# Patient Record
Sex: Male | Born: 1940 | Race: White | Hispanic: No | Marital: Married | Smoking: Never smoker
Health system: Southern US, Community
[De-identification: ages and names within clinical notes are randomized; demographics above are authoritative.]

## PROBLEM LIST (undated history)

## (undated) DIAGNOSIS — E785 Hyperlipidemia, unspecified: Secondary | ICD-10-CM

## (undated) DIAGNOSIS — I350 Nonrheumatic aortic (valve) stenosis: Secondary | ICD-10-CM

## (undated) DIAGNOSIS — I10 Essential (primary) hypertension: Secondary | ICD-10-CM

## (undated) DIAGNOSIS — L98429 Non-pressure chronic ulcer of back with unspecified severity: Secondary | ICD-10-CM

## (undated) DIAGNOSIS — E119 Type 2 diabetes mellitus without complications: Secondary | ICD-10-CM

## (undated) HISTORY — DX: Type 2 diabetes mellitus without complications: E11.9

## (undated) HISTORY — PX: OTHER SURGICAL HISTORY: SHX169

## (undated) HISTORY — DX: Essential (primary) hypertension: I10

## (undated) HISTORY — DX: Hyperlipidemia, unspecified: E78.5

## (undated) HISTORY — PX: SHOULDER SURGERY: SHX246

---

## 1998-11-09 ENCOUNTER — Encounter: Admission: RE | Admit: 1998-11-09 | Discharge: 1999-02-07 | Payer: Self-pay

## 2000-07-09 ENCOUNTER — Ambulatory Visit (HOSPITAL_COMMUNITY): Admission: RE | Admit: 2000-07-09 | Discharge: 2000-07-09 | Payer: Self-pay | Admitting: Orthopedic Surgery

## 2000-07-09 ENCOUNTER — Encounter: Payer: Self-pay | Admitting: Orthopedic Surgery

## 2000-07-25 ENCOUNTER — Encounter: Payer: Self-pay | Admitting: Orthopedic Surgery

## 2000-07-31 ENCOUNTER — Inpatient Hospital Stay (HOSPITAL_COMMUNITY): Admission: RE | Admit: 2000-07-31 | Discharge: 2000-08-03 | Payer: Self-pay | Admitting: Orthopedic Surgery

## 2000-07-31 ENCOUNTER — Encounter: Payer: Self-pay | Admitting: Orthopedic Surgery

## 2000-07-31 ENCOUNTER — Encounter (INDEPENDENT_AMBULATORY_CARE_PROVIDER_SITE_OTHER): Payer: Self-pay | Admitting: Specialist

## 2000-09-13 ENCOUNTER — Observation Stay (HOSPITAL_COMMUNITY): Admission: RE | Admit: 2000-09-13 | Discharge: 2000-09-14 | Payer: Self-pay | Admitting: Orthopedic Surgery

## 2000-10-09 ENCOUNTER — Inpatient Hospital Stay (HOSPITAL_COMMUNITY): Admission: EM | Admit: 2000-10-09 | Discharge: 2000-10-11 | Payer: Self-pay | Admitting: Orthopedic Surgery

## 2000-10-14 ENCOUNTER — Inpatient Hospital Stay (HOSPITAL_COMMUNITY): Admission: EM | Admit: 2000-10-14 | Discharge: 2000-10-16 | Payer: Self-pay | Admitting: Emergency Medicine

## 2000-12-13 ENCOUNTER — Inpatient Hospital Stay (HOSPITAL_COMMUNITY): Admission: RE | Admit: 2000-12-13 | Discharge: 2000-12-17 | Payer: Self-pay | Admitting: Orthopedic Surgery

## 2000-12-16 ENCOUNTER — Encounter: Payer: Self-pay | Admitting: Orthopedic Surgery

## 2001-01-27 ENCOUNTER — Inpatient Hospital Stay (HOSPITAL_COMMUNITY): Admission: RE | Admit: 2001-01-27 | Discharge: 2001-01-31 | Payer: Self-pay | Admitting: Orthopedic Surgery

## 2001-01-27 ENCOUNTER — Encounter: Payer: Self-pay | Admitting: Orthopedic Surgery

## 2001-01-27 ENCOUNTER — Encounter (INDEPENDENT_AMBULATORY_CARE_PROVIDER_SITE_OTHER): Payer: Self-pay

## 2001-02-20 ENCOUNTER — Encounter: Payer: Self-pay | Admitting: Orthopedic Surgery

## 2001-02-20 ENCOUNTER — Encounter: Admission: RE | Admit: 2001-02-20 | Discharge: 2001-02-20 | Payer: Self-pay | Admitting: Orthopedic Surgery

## 2001-10-20 ENCOUNTER — Inpatient Hospital Stay (HOSPITAL_COMMUNITY): Admission: RE | Admit: 2001-10-20 | Discharge: 2001-10-23 | Payer: Self-pay | Admitting: Orthopedic Surgery

## 2001-10-20 ENCOUNTER — Encounter (INDEPENDENT_AMBULATORY_CARE_PROVIDER_SITE_OTHER): Payer: Self-pay

## 2001-10-20 ENCOUNTER — Encounter: Payer: Self-pay | Admitting: Orthopedic Surgery

## 2002-01-26 ENCOUNTER — Observation Stay (HOSPITAL_COMMUNITY): Admission: RE | Admit: 2002-01-26 | Discharge: 2002-01-27 | Payer: Self-pay | Admitting: Orthopedic Surgery

## 2003-10-01 ENCOUNTER — Inpatient Hospital Stay (HOSPITAL_COMMUNITY): Admission: RE | Admit: 2003-10-01 | Discharge: 2003-10-05 | Payer: Self-pay | Admitting: Orthopedic Surgery

## 2003-10-11 ENCOUNTER — Inpatient Hospital Stay (HOSPITAL_COMMUNITY): Admission: EM | Admit: 2003-10-11 | Discharge: 2003-10-14 | Payer: Self-pay | Admitting: Orthopedic Surgery

## 2004-05-17 ENCOUNTER — Ambulatory Visit (HOSPITAL_COMMUNITY): Admission: RE | Admit: 2004-05-17 | Discharge: 2004-05-17 | Payer: Self-pay | Admitting: Orthopedic Surgery

## 2004-09-06 ENCOUNTER — Encounter (INDEPENDENT_AMBULATORY_CARE_PROVIDER_SITE_OTHER): Payer: Self-pay | Admitting: Specialist

## 2004-09-06 ENCOUNTER — Inpatient Hospital Stay (HOSPITAL_COMMUNITY): Admission: RE | Admit: 2004-09-06 | Discharge: 2004-09-09 | Payer: Self-pay | Admitting: Orthopedic Surgery

## 2004-10-16 ENCOUNTER — Encounter: Admission: RE | Admit: 2004-10-16 | Discharge: 2004-12-27 | Payer: Self-pay | Admitting: Orthopedic Surgery

## 2005-02-28 ENCOUNTER — Inpatient Hospital Stay (HOSPITAL_COMMUNITY): Admission: AC | Admit: 2005-02-28 | Discharge: 2005-03-05 | Payer: Self-pay

## 2005-04-26 ENCOUNTER — Encounter: Admission: RE | Admit: 2005-04-26 | Discharge: 2005-05-23 | Payer: Self-pay | Admitting: Orthopedic Surgery

## 2005-05-24 ENCOUNTER — Encounter: Admission: RE | Admit: 2005-05-24 | Discharge: 2005-06-19 | Payer: Self-pay | Admitting: Orthopedic Surgery

## 2005-06-20 ENCOUNTER — Encounter: Admission: RE | Admit: 2005-06-20 | Discharge: 2005-07-18 | Payer: Self-pay | Admitting: Orthopedic Surgery

## 2005-07-19 ENCOUNTER — Encounter: Admission: RE | Admit: 2005-07-19 | Discharge: 2005-08-13 | Payer: Self-pay | Admitting: Orthopedic Surgery

## 2005-08-14 ENCOUNTER — Encounter: Admission: RE | Admit: 2005-08-14 | Discharge: 2005-11-12 | Payer: Self-pay | Admitting: Orthopedic Surgery

## 2007-05-16 ENCOUNTER — Emergency Department (HOSPITAL_COMMUNITY): Admission: EM | Admit: 2007-05-16 | Discharge: 2007-05-16 | Payer: Self-pay | Admitting: Emergency Medicine

## 2007-05-19 ENCOUNTER — Encounter: Admission: RE | Admit: 2007-05-19 | Discharge: 2007-05-19 | Payer: Self-pay

## 2007-07-10 ENCOUNTER — Ambulatory Visit (HOSPITAL_COMMUNITY): Admission: RE | Admit: 2007-07-10 | Discharge: 2007-07-11 | Payer: Self-pay | Admitting: Orthopedic Surgery

## 2007-07-10 ENCOUNTER — Ambulatory Visit: Payer: Self-pay | Admitting: Cardiology

## 2007-07-11 ENCOUNTER — Encounter (INDEPENDENT_AMBULATORY_CARE_PROVIDER_SITE_OTHER): Payer: Self-pay | Admitting: Orthopedic Surgery

## 2007-10-20 ENCOUNTER — Ambulatory Visit (HOSPITAL_COMMUNITY): Admission: RE | Admit: 2007-10-20 | Discharge: 2007-10-20 | Payer: Self-pay | Admitting: Orthopedic Surgery

## 2011-01-23 NOTE — Op Note (Signed)
Patrick Tate, Patrick Tate               ACCOUNT NO.:  [id number]   MEDICAL RECORD NO.:  [id number]          PATIENT TYPE:  OIB   LOCATION:  1235                         FACILITY:  [facility]   PHYSICIAN:  Almedia Balls. Ranell Patrick, M.D. DATE OF BIRTH:  10/01/1940   DATE OF PROCEDURE:  07/10/2007  DATE OF DISCHARGE:                               OPERATIVE REPORT   PREOPERATIVE DIAGNOSES:  Right elbow triceps rupture and right hand  carpal tunnel syndrome.   POSTOPERATIVE DIAGNOSES:  Right elbow triceps rupture and right hand  carpal tunnel syndrome.   PROCEDURE PERFORMED:  Right elbow triceps repair as well as right hand  carpal tunnel release.   SURGEON:  Almedia Balls. Ranell Patrick, M.D.   ASSISTANT:  Modesto Charon, [place].   General anesthesia was used.  Fluid replacement was 1000 mL crystalloid.   ESTIMATED BLOOD LOSS:  Minimal.   TOURNIQUET TIME:  1 hour and 30 minutes at 275 mmHg.   INSTRUMENT COUNTS:  Correct.   COMPLICATIONS:  None.   ANTIBIOTICS:  Preoperative antibiotics were given.   INDICATIONS:  The patient is a 70 year old male with a history of injury  to his right arm.  The patient presented to orthopedics with obvious  triceps rupture and MRI scan confirming that the patient also has  longstanding carpal tunnel syndrome which was graded as severe and  because he was going to be recovering from his right elbow surgery he as  requested that we go ahead and do his carpal tunnel release at the same  time. Informed consent was obtained.   DESCRIPTION OF PROCEDURE:  After an adequate level of anesthesia was  achieved, the patient was positioned supine on the operating room table,  an arm board was utilized, a nonsterile tourniquet was placed on the  right proximal arm. We exsanguinated the limb using Esmarch bandage,  elevated the tourniquet to 275 mmHg, a longitudinal palmar incision was  created in line with the third and fourth ray. Dissection was carried  sharply down through the  subcutaneous tissues.  The superficial palmar  fascia was incised.  This revealed a transverse carpal ligament which we  incised in line with the skin incision.  We protected the median nerve  during this portion of the procedure utilizing a hemostat released into  the metacarpal space and all the way into the superficial forearm  fascia.  Complete release of the nerve was performed and the nerve  appeared to be in good shape. I thoroughly irrigated and closed using  interrupted nylon suture followed by compressive dressing.  We then  directed our attention towards the triceps and performed a midline  incision along the posterior elbow. Dissection was carried down to the  bursa, the bursa was very swollen and quite a bit of fluid was expressed  including a lot debris. 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  We went in and performed a bursectomy just removing excess tissue so  that we could better see the repair. There was a complete triceps  rupture. The triceps was broken into several pieces in several layers  with a deep layer and superficial layer. We combined those two together,  placed a running [place] or baseball stitch with #2 FiberWire suture  going up the tendon. A total of two sutures in four strands coming out  the end of the tendon. We then freshened up the insertion site on the  olecranon with a rongeur getting all the old tendon off that area and  freshening up the bleeding bone.  We then placed three drill holes in  the bone exiting out more distally on the ulnar shaft and then protected  the ulnar nerve. We  identified and protected it during this portion of  the procedure.  We then brought those sutures through and then tied them  in a mattress fashion with the elbow extended to make sure we had good  bone to tendon contact. The tendon was in pretty poor shape with quite a  bit of bony infiltrations within the tendon itself.  We removed several  pieces of bone  just because they obstructed the repair.  We then reached  and repaired the retinaculum to make sure that helped support our  repair.  We did not have any tension up to about 30 degrees of flexion  of the elbow from the fully extended position.  We thoroughly irrigated  the wound. We checked the nerve one more time and made sure it was not  included in the repair and incarcerated. We went ahead at this point and  closed the subcutaneous tissues with a combination of #0 and 2#0 Vicryl  followed by 2-0 Vicryl subcutaneous closure and then staples for the  skin. A sterile compressive bandage and a long arm splint was applied.  The patient tolerated the surgery well.      Almedia Balls. Ranell Patrick, M.D.  Electronically Signed     SRN/MEDQ  D:  07/10/2007  T:  07/11/2007  Job:  [id number]

## 2011-01-23 NOTE — Op Note (Signed)
Patrick Tate, Patrick Tate               ACCOUNT NO.:  [id number]   MEDICAL RECORD NO.:  [id number]          PATIENT TYPE:  AMB   LOCATION:  SDS                          FACILITY:  [facility]   PHYSICIAN:  Almedia Balls. Ranell Patrick, M.D. DATE OF BIRTH:  September 02, 1941   DATE OF PROCEDURE:  10/20/2007  DATE OF DISCHARGE:  10/20/2007                               OPERATIVE REPORT   PREOPERATIVE DIAGNOSIS:  Right ulnar nerve palsy.   POSTOPERATIVE DIAGNOSIS:  Right ulnar nerve palsy.   PROCEDURE:  In situ ulnar nerve release.   SURGEON:  Almedia Balls. Ranell Patrick, M.D.   ASSISTANT:  Donnie Coffin. Durwin Nora, P.A.   ANESTHESIA:  General anesthesia plus axillary block anesthesia was used.   ESTIMATED BLOOD LOSS:  Minimal.   COUNTS:  Correct.   COMPLICATIONS:  None.  Perioperative antibiotics were given.   FLUIDS REPLACED:  1000 mL crystalloid.   INDICATIONS FOR PROCEDURE:  The patient is a 70 year old male who is now  several months status post right elbow triceps repair and carpal tunnel  release.  The patient has had worsening intrinsic wasting and signs of  ulnar nerve compression which is obviously positive for compression at  the elbow based on the imaging nerve conduction studies.  The patient is  brought to surgery for rapidly progressive ulnar nerve symptoms.  Informed consent was obtained.   DESCRIPTION OF PROCEDURE:  After an adequate level of anesthesia  achieved, the patient was positioned supine on the operating table.  The  right arm was sterilely prepped and draped in the usual fashion.  We  explored the ulnar nerve through an ulnar based incision overlying the  cubital tunnel.  Dissection was carried sharply down through the  subcutaneous tissues using loupe magnification.  We identified the ulnar  nerve proximally and then tracked it down through the cubital tunnel and  across into the volar forearm fascia performing a complete release.  The  nerve was just noted to be compressed directly in the  cubital tunnel and  had a wasp waist deformity present there consistent with chronic  compression.  This was decompressed all the way up into the proximal arm  and distally  into the forearm.  Following this we thoroughly irrigated and closed  loosely with 2-0 Vicryl subcutaneous closure and 4-0 Monocryl for skin.  Steri-Strips were applied followed by a sterile compressive dressing and  a long arm splint.  The patient tolerated the procedure well.      Almedia Balls. Ranell Patrick, M.D.  Electronically Signed     SRN/MEDQ  D:  11/10/2007  T:  11/10/2007  Job:  [id number]

## 2011-01-23 NOTE — Consult Note (Signed)
Patrick Tate, Patrick Tate               ACCOUNT NO.:  [id number]   MEDICAL RECORD NO.:  [id number]          PATIENT TYPE:  OIB   LOCATION:  1235                         FACILITY:  [facility]   PHYSICIAN:  Beckey Rutter, MD  DATE OF BIRTH:  April 28, 1941   DATE OF CONSULTATION:  07/10/2007  DATE OF DISCHARGE:                                 CONSULTATION   REASON FOR CONSULTATION:  Evaluation for fever, wheezes and hypotension.   HISTORY OF PRESENT ILLNESS:  This is a 70 year old Caucasian male with  multiple medical problems as below.  Patient developed fever and wheezes  during surgery.  Patient now is in PACU.  He was extubated and partially  awake, now giving history, and he stated that he has had a common cold  for the last few days.  He also stated that he was having fever at home  and that fever was associated with cough and phlegm.  Starting this  morning, the phlegm is yellowish in color.  Patient also stated that his  wife, at home, has similar complaint of mild fever and upper respiratory  infection symptoms.  He denied having history of chronic obstructive  pulmonary disease and he denied a history of chronic smoking and tobacco  abuse.  The patient had the surgery for the right triceps tendon rupture  and  carpal tunnel release.  The patient had developed tachycardia  during the procedure and after the surgery and he was given labetalol  intravenously 10 mg with fair control of his tachycardia below 100, but  he also developed transient hypotension with quick response of the blood  pressure to IV fluids.  Now, systolic blood pressure is 103.   PAST MEDICAL HISTORY/PAST SURGICAL HISTORY:  1. Patient just underwent right-hand  carpal tunnel release and right      elbow triceps repair.  2. 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  3. Depression.  4. Anxiety.  5. Gastroesophageal reflux disease.  6. Hiatal hernia.  7. History of renal calculi.  8. History of  rheumatoid arthritis.   PAST MEDICAL HISTORY:  Noncontributory, but patient has recent contact  with family member with upper respiratory infection, fever and cough.   SOCIAL HISTORY:  He denied smoking.  He lives with his wife.   MEDICATION ALLERGIES:  RIFAMPICIN.   MEDICATIONS:  1. Micardis 80 mg daily.  2. Cymbalta 60 mg daily.  3. Omeprazole 20 mg daily.  4. Prednisone 5 mg twice a day.  5. Pravastatin 40 mg nightly.  6. Alprazolam 5 mg q. 8 hours.  7. Morphine.  8. Aspirin 81 mg.  9. Caltrate 60 mg daily.  10.Ferrous sulfate daily.   REVIEW OF SYSTEMS:  As per History of Present Illness.  Patient is still  under the anesthesia effect and he returned to sleep before answering  question.   ON EXAM:  Heart rate is 93 per minute, respiratory rate is 16 per  minute, blood pressure on the monitor, systolic blood pressure is 103.  GENERALLY:  He is lying flat on the bed, not in acute distress.  HEAD:  Atraumatic, normocephalic.  EYES:  PERRL.  MOUTH:  Moist, no ulcer.  NECK:  Supple, no JVD.  LUNGS:  Patient has bilateral crepitation and expiratory wheezes, mainly  on the left side of his chest, on auscultation.  PRECORDIUM EXAMINATION:  First and second heart sounds audible, no other  sound appreciated.  EXTREMITIES:  Patient has bilateral below-the-knee amputation.  NEUROLOGICALLY:  Patient is status post anesthesia, he is able to be  awakened, communicative, but he is obviously still under anesthesia  effect.   LABORATORY DATA AND X-RAY:  EKG shows sinus rhythm at 93 heart beats per  minute.  The chest x-ray is pending.  Recent lab work is still pending.   ASSESSMENT AND PLAN:  This is a 70 year old male with upper respiratory  infection, who developed fever and tachycardia, likely secondary to  bronchitis, viral versus bacterial, with the possibility of pneumonia,  which could not be ruled out at this time.   PLAN:  1. Patient will be started on antibiotics,  Rocephin and Zithromax.  We      will continue the nasal oxygen.  We will continue nebulization as      needed.  We will send for blood culture and we will follow through      with the white blood count and differential.  The chest x-ray is      ordered.  We will follow that for evidence of infiltrate, versus      bronchitis.  2. Hypotension.  Responsive to bolus at this time and it seems it is      secondary to the labetalol patient received during surgery.  We      will continue to monitor blood pressure and we will continue IV      fluids with normal saline boluses.  3. Patient's bout of tachycardia seems secondary to the fever.      Nevertheless, I will rule out ACS with serial cardiac enzymes.  His      EKG is not showing evidence of acute coronary syndrome/MI, as of      now.  For DVT prophylaxis, I might consider Lovenox, but I will      leave the decision to the primary service, orthopedics, on that.      For GI prophylaxis, I would continue patient on the omeprazole      which he is taking at home.   Thank you for the consultation.  [facility] Team C will follow through  with you.      Beckey Rutter, MD  Electronically Signed     EME/MEDQ  D:  07/10/2007  T:  07/11/2007  Job:  [id number]

## 2011-01-26 NOTE — Op Note (Signed)
[name]  Patient:    Patrick Tate, Patrick Tate Visit Number: [id number] MRN: [id number]          Service Type: SUR Location: 4W [id number] [id number] Attending Physician:  Loanne Drilling Dictated by:   Ollen Gross, M.D. Proc. 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  PROCEDURE:  Left tibial revision.  SURGEON:  Ollen Gross, M.D.  ASSISTANT:  Alexzandrew L. Perkins, P.A.-C.  ANESTHESIA:  General.  ESTIMATED BLOOD LOSS:  Minimal.  DRAIN:  Hemovac x 1.  TOURNIQUET TIME:  92 minutes at 350 mmHg.  COMPLICATIONS:  None.  CONDITION:  Stable to recovery.  BRIEF CLINICAL NOTE:  Kyree is a 70 year old male with multiple knee reconstructions.  He had a reimplantation of a total knee arthroplasty last year and subsequently developed significant pretibial pain.  X-rays suggest possible loosening.  His infection work-up has been negative, and he presents now for tibial revision.  DESCRIPTION OF PROCEDURE:  After the successful administered of general anesthetic, a tourniquet is placed high on the left thigh and left lower extremity prepped and draped in the usual sterile fashion.  Exam under anesthesia is done.  He has about 5 mm of varus and valgus play with firm end points.  He hyperextends about 5 degrees, flexes down to 130.  At this point, the left lower extremity is prepped and draped in the usual sterile fashion. Extremity is wrapped in Esmarch, knee flexed, and tourniquet inflated to 350 mmHg.  A previous midline medial peripatella incision is made with the 10 blade down to the level of the extensor mechanism.  A fresh blade is used to make a medial parapatellar arthrotomy.  He previously needed a quadriceps snip, and we just went ahead and did  that at this point.  Quad snip is performed, patella is everted, knee flexed 90 degrees.  There was a fair amount of scar tissue present and excised.  There was minimal fluid in the joint, that sent for STAT Gram stain CNS which shows no organisms, rare white cells.  We also sent multiple frozen sections which show an occasional focus of greater than 5 neutrophils per high-powered field but no evidence of acute, active inflammation.  At this point, the tibial polyethylene is easily removed from the tray.  The tray was a revision Press-Fit stem with a 15 mm platform medially and laterally.  I was able to disrupt the interface between bone and cement, and the tray came out easily, consistent with a loose tibial component.  At this point, the cut bone surfaces are debrided with a curette and rongeur to remove all of the fibrous tissue.  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  Once the canal is fully cleaned, it is then reamed up to 17 mm with the hand reamers.  He had a a lot of proximal bone loss but still had a good metaphyseal cortical shell. It was decided to use femoral head allograft to build up the proximal tibia and decrease the flexion  and extension gaps.  The frozen allograft femoral head is then sectioned to give a medial and lateral piece.  It is temporarily pinned with K-wires, and this had a great fit after we had contoured it with the TPS bur, both on the patients native tibial side as well as on the allograft.  The intermedullary cutting guide is placed, and we just shaved off the surface of the graft.  This was found to e very stable.  Again, we reamed the canal up to 17 to the appropriate depth utilizing the graft as a reference.  We did a keel punch proximally.  He had previously  had a size 3 tibial component which was found to be the appropriate size for the proximal tibia with the graft in place.  We needed a medial offset with the stem being more medial in order to get this down the canal with good bone coverage.  At this point, the wound is further irrigated, and the permanent size 3 [employer] tibial tray with a 15 mm medial and lateral platform as well as the 15 x 60 cemented stem are placed together on the back table.  We had a trial in place just to see what size spacer we would need, and a 22 five thought to be the most appropriate with great stability in flexion and extension.  The wound is further irrigated with pulsatile lavage; cement is mixed utilizing two batches of the DePuy One cement with two vials of tobramycin, two vials of vancomycin.  Once the cement is mixed and ready for implantation, it is injected into the canal and pressurized.  A stent restrictor was previously placed.  The prosthesis is then cemented into position.  Once the cement is fully hardened, then a trial 22.5 TC-3 spacer is placed, and there is a little bit of hyperextension, and we went to a 26 which had great varus-valgus balance throughout the full range of motion and did not allow for any hyperextension.  The permanent 25 mm TC-3 insert with a size 3 tibial tray is then placed.  Again, his knee is reduced with great stability. The wound is copiously irrigated with antibiotic solution and the extensor mechanism closed over a Hemovac drain with interrupted #1 PDS.  Tourniquet had been released prior to placement of the polyethylene spacer in order to assure that there was no significant bleeding and hemostasis had easily been achieved.  After the extensor mechanism was closed, I flexed the knee against gravity at 130 degrees.  Subcu was closed with interrupted 2-0 Vicryl and skin with staples.  Bulky sterile dressing is applied, drain hooked to suction, patient awakened  and transported to recovery in stable condition. Dictated by:   Ollen Gross, M.D. Attending Physician:  Loanne Drilling DD:  10/20/01  TD:  10/20/01 Job: [id number]

## 2011-01-26 NOTE — Discharge Summary (Signed)
[facility]  Patient:    Patrick Tate, Patrick Tate                      MRN: [id number] Adm. Date:  [id number] Disc. Date: [id number] Attending:  Loanne Drilling Dictator:   Verlin Fester, P.A.-C.                           Discharge Summary  ADMITTING DIAGNOSES: 1. 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 2. History of rheumatic fever. 3. Gastroesophageal reflux disease. 4. History of renal calculi treated with lithotripsy. 5. Rheumatoid arthritis, steroid-dependent.  DISCHARGE DIAGNOSES: 1. Status post reimplantation of a left total knee. 2. Postoperative hemorrhagic anemia. 3. History of rheumatic fever. 4. Gastroesophageal reflux disease 5. History of renal calculi treated with lithotripsy. 6. Rheumatoid arthritis, steroid-dependent.  PROCEDURE:  Irrigation and debridement left knee with reimplantation of a left total knee prosthesis.  Surgeon was Dr. Homero Fellers Aluisio.  Assistant was Dr. Ronnell Guadalajara.  Anesthesia was general.  CONSULTS:  None.  BRIEF HISTORY:  The patient is a 70 year old white male, status post left total knee replacement revision in 2001.  Following this procedure, he has had multiple episodes of hematoma and has undergone irrigation and debridement on more than one occasion.  He was noted to have coag negative staph infection in January 2002, with repeat cultures in March 2002, showing coag negative staph. He was treated with prolonged oral antibiotics.  He has continued to have pain in his knee with edema.  He continues reaccumulation of hematoma in the knee, and he underwent a resection arthroplasty of an infected left total knee replacement on December 13, 2000.  He will be readmitted to [facility] for reimplantation of left total knee prosthesis.  LABORATORY DATA:  CBC on Jan 24, 2001, hemoglobin and hematocrit were both low at 10.2 and 32.1, respectively.  Platelet count was elevated at 527  on that same date.  Postoperatively hemoglobin dropped down to a low of 7.7 and hematocrit of 24.1 on Jan 28, 2001.  These increased slightly to 8.2 and 25.3, respectively on Jan 29, 2001.  On Jan 31, 2001, hemoglobin and hematocrit were 7.9 and 24.8, respectively.  His PT and INR preadmission were 13.9 and 1.1, respectively.  PTT was 27.  Postoperatively on Jan 28, 2001, his PT and INR were 15.7 and 1.4.  These increased up to 19.5 and 2.1 on Jan 31, 2001.  This was while on Coumadin.  Routine chemistries from Jan 24, 2001 were within normal limits with the exception of a creatinine slightly elevated at 1.8.  On Jan 28, 2001, these were repeated and were within normal limits with the exception of a slightly elevated glucose at 147, creatinine slightly elevated at 1.9, and calcium slightly low at 8.3.  On Jan 24, 2001, ALT 18, ALP 33 which is high, total bilirubin 0.7.  Urinalysis done on Jan 24, 2001 was negative.  Blood type is Rh type positive and antibody screen negative done on Jan 24, 2001.  Gram stain done on fluid aspirated from the left knee on Jan 27, 2001, showed no organisms, no WBCs.  X-RAYS:  There are no x-ray reports seen in the chart and no EKG seen in this chart.  HOSPITAL COURSE:  The patient tolerated his surgical procedure well.  Hemovac drain was placed intraoperatively, and this was removed on postop day one without any difficulty by  Dr. Homero Fellers Aluisio.  The patients hemoglobin did drop to a low of 7.7 and hematocrit of 24.1 on postop day one.  He was transfused two units of packed red blood cells on postop day one.  He tolerated that well, and his hemoglobin was increased to 8.2 following the transfusion, and he was asymptomatic.  It continued and was stabilized in the low 8s his hemoglobin throughout the rest of his hospital stay, and he remained asymptomatic and did not require any further transfusions.  Cultures that were taken from the left knee continued to  show no growth, no organisms throughout his hospital stay.  Pain was immediately well controlled on morphine PCA pump.   This was discontinued on postop day two, and the patients pain was well controlled on p.o. analgesics.  The patient did have the hiccups on postop day two, and Thorazine 10 mg 1 time dose orally was given which did resolve that problem.  Physical therapy and occupational therapy were also consulted postoperatively, and the patient did very well with them, ambulating on postop day two without any assistance greater than 200 feet.  His incision dressing was removed on postop day two, and the incision was without any erythema or discharge.  Dressing changes were done q.d., and the incision continued to look good throughout his hospital stay. The patient was using the passive motion machine 0-40 degrees while in the hospital.  Discharge planning was also consulted to assist with setting up the patients home health physical therapy and nursing as needed and equipment that he would need at home.  The patient continued to progress extremely well throughout his hospital stay.  He was kept to monitor his hemoglobin to make that it continued to be stable and asymptomatic, which it did and on postoperative day four, which was Jan 31, 2001, the patient was doing extremely well, and he was ready for discharge home.  He was neurovascularly intact to the left lower extremity and moving very well, ambulating without any difficulty.  Sensation was intact to the left lower extremity, and he was felt to be safe for discharge home.  DISCHARGE MEDICATIONS: 1. Keflex 500 mg 1 tablet p.o. t.i.d. x 1 month. 2. Robaxin 500 mg 1 p.o. q.6-8h. p.r.n. spasm. 3. Percocet 1-2 tablets p.o. q.4-6h. p.r.n. pain. 3. Coumadin 7.5 mg tablets take 1 tablet q.d. at 6 p.m. 4. Resume home medications.  The patients Coumadin as an outpatient will be    managed by Dr. Barbaraann Cao office.  I spoke with them,  and they had    agreed to follow his Coumadin as an outpatient.  ACTIVITY:  It is okay for him to shower.  Knee immobilizer at night.  Weightbearing as tolerated.  Ice pack to the left knee 20 minutes every 4-6 hours as needed.  FOLLOW-UP:  He is to follow up with Dr. Ollen Gross on May 31. 2002.  He is to call [phone] for an appointment.  DIET:  He is to resume his regular diet as tolerated.  DISPOSITION:  He is being discharged to his home with physical therapy as an outpatient and Coumadin management per his family doctor.  CONDITION ON DISCHARGE:  Stable and improved, status post left total knee reimplantation of prosthesis. DD:  02/05/01 TD:  02/05/01 Job: [id number]

## 2011-01-26 NOTE — H&P (Signed)
[facility]  Patient:    Patrick Tate, Patrick Tate                      MRN: [id number] Adm. Date:  [id number] Attending:  Cain Sieve Dictator:   Druscilla Brownie. Bettey Costa. CC:         Helene Kelp, M.D.   History and Physical  CHIEF COMPLAINT:  "Bleeding from my left knee."  PRESENT ILLNESS:  [age]-year-old white male who has had a recent [facility] admission on October 09, 2000, with a discharge on October 11, 2000, for bleeding into and hematoma in his left knee.  The patient underwent I&D of the left knee with closure over drains on October 09, 2000.  He was in the hospital for several days on IV antibiotic therapy, eventually going to p.o. Levaquin b.i.d.  He was discharged this past Friday and was doing well over the weekend and his wife has been emptying the Hemovac (large), getting about 2 ounces t.i.d.  He has had no other bleeding than from the Hemovac.  This morning, he was doing well and the wife went to work after she had emptied about an ounce from the Hemovac.  He called her later this morning and told her that he was bleeding.  She came home and there was "a large amount of blood" over the chair in which he was sitting and dressings, etc.  They called the office and were told to come to the emergency room.  When we saw the patient, indeed, Hemovac was examined and it was clotted.  When the patient flexed the knee, he had a moderate amount of bleeding coming from the proximal anterior surgical wound.  The dressing that was on his knee, which had been cut away, had a considerable amount of blood in it.  There was no evidence of any pus at all; it was purely a bloody drainage.  Neurovascular was intact to the left lower extremity.  His mother, wife and son were with the patient in the emergency room.  After discussion with Dr. Ollen Gross, the Hemovac was discontinued and a moderate amount of blood came out of the Hemovac portal  site.  It was decided to do some blood work, CBC with differential and BMET and to go ahead and get a pro time on the patient.  We will admit him for observation and possible repeat I&D of the left knee.  PAST MEDICAL HISTORY:  Please see previous histories and physicals.  CURRENT MEDICATIONS 1. Prozac 20 mg one q.a.m. 2. Prilosec 20 mg one q.a.m. 3. Xanax 0.5 mg q.p.m. 4. Voltaren 75 mg b.i.d. 5. Prednisone 5 mg two q.a.m. 6. OxyContin 10 mg one b.i.d. 7. Trinsicon one b.i.d. 8. Levaquin 500 mg one b.i.d.  ALLERGIES:  No known drug allergies.  SOCIAL HISTORY:  Unchanged.  REVIEW OF SYSTEMS:  Unchanged.  PHYSICAL EXAMINATION  GENERAL:  Alert and cooperative 70 year old white male seen lying on an emergency room stretcher in the hallway.  VITAL SIGNS:  Vital signs are normal.  Please see nursing H&P for vitals.  HEENT:  Normocephalic.  PERRLA.  Oropharynx is clear.  CHEST:  Clear to auscultation.  No rhonchi nor rales.  No wheezes.  HEART:  Regular rate and rhythm.  No murmurs are heard.  ABDOMEN:  Obese, soft, nontender.  Liver and spleen not felt.  GENITALIA:  Not done, not pertinent to present illness.  RECTAL:  Not done, not  pertinent to present illness.  EXTREMITIES:  Neurovascularly intact to the left lower extremity.  He is bleeding, as mentioned, from the knee incision area as well as the Hemovac portal site, as mentioned above.  ADMITTING DIAGNOSES 1. Hematoma, left knee. 2. History of rheumatic fever. 3. Gastroesophageal reflux disease, treated by Dr. Sabino Gasser. 4. History of renal calculi, treated with lithotripsy. 5. Rheumatoid arthritis, steroid dependent.  PLAN:  The patient is to be admitted for observation and possible I&D of his left knee again. DD:  10/14/00 TD:  10/15/00 Job: [id number]

## 2011-01-26 NOTE — Op Note (Signed)
[facility]  Patient:    Patrick Tate, Patrick Tate                      MRN: [id number] Proc. Date: 09/13/00 Adm. Date:  [id number] Disc. Date: [id number] Attending:  Ollen Gross V                           Operative Report  PREOPERATIVE DIAGNOSIS:  Hematoma left knee, status post total revision with persistent drainage.  POSTOPERATIVE DIAGNOSIS:  Hematoma left knee, status post total revision with persistent drainage.  PROCEDURE:  Arthroscopic incision and drainage of left knee with closure over drain and debridement of skin tract.  SURGEON:  Ollen Gross, M.D.  ASSISTANT:  None.  ANESTHESIA:  General.  ESTIMATED BLOOD LOSS:  Minimal.  DRAINS:  Hemovac x 1.  COMPLICATIONS:  None.  CONDITION:  Stable to recovery.  BRIEF CLINICAL NOTE:  Patrick Tate is a 70 year old male who had a total knee revision of the tibial component and removal of the patella component approximately six weeks ago.  He did great initially and then about a week postop had a tiny area that had serous drainage.  This was consistent with a draining hematoma.  This stopped, and then the area opened again about a week ago.  He is noted not to have fever, chills, or warmth in the knee.  He has never had pain.  He has always had a mild amount of swelling.  He presents now for drainage of the hematoma with cultures to rule out infection.  DESCRIPTION OF PROCEDURE:  After the successful administration of general anesthetic, tourniquet is placed high on the left thigh and left lower extremity prepped and draped in the usual sterile fashion.  An arthroscopic superomedial portal incision is made and Foley cannula passed into the joint and some bloody/serous fluid came out through the cannula, and this was sent for Gram stain CNS.  This had the appearance of a liquefied hematoma and did not have appearance of infection.  Incision is made at the inferolateral portal then the cannula passed into  the joint and inflow initiated.  The joint itself looked fine.  There was no evidence of any significant synovitis, and there was no evidence of any pockets of fibrinous debris or purulence.  He did have some scar tissue forming in the suprapatellar area, and through an inferomedial portal, this tissue was shaved down to a cleaner base. Approximately 12 liters of arthroscopic fluid was irrigated through the joint. A large bore Hemovac drain is then placed into the joint through the inferior portal, and the other portal was closed with nylon.  The area that had the tiny pinpoint amount of drainage was then sharply debrided with a knife down to the fascia, and there was no evidence of any rents in the fascia.  There was no evidence of any sinus tract either.  Any abnormal tissue was debrided. This was thoroughly irrigated.  Subcutaneous closed with interrupted 2-0 Vicryl and skin closed with interrupted 4-0 nylon.  A bulky sterile dressing was applied and Hemovac hooked to the suction canister.  The patient was then awakened and transported to recovery in stable condition.  The plan is to check the cultures and if positive, then he will have placement of a PICC line with IV antibiotics for 4-6 weeks.  If negative, which I suspect it will be negative based on the benign appearance of the joint,  then he may be discharged to home with the Hemovac in place to be removed in the office on postop day three. DD:  09/13/00 TD:  09/13/00 Job: [id number]

## 2011-01-26 NOTE — H&P (Signed)
[facility]  Patient:    Patrick Tate, Patrick Tate                      MRN: [id number] Adm. Date:  [id number] Disc. Date: [id number] Attending:  Loanne Drilling Dictator:   Verlin Fester, P.A.-C.                         History and Physical  CHIEF COMPLAINT:  Left knee pain.  HISTORY OF PRESENT ILLNESS:  Patient is a 70 year old male who had his second total knee revision with just a tibial revision and polyethylene revision back in December 2000 and he subsequently has had a recurrent hemiarthrosis with evidence of a Staph infection.  This was sensitive to oral antibiotics.  He had an arthroscopic I&D and subsequently open I&D with attempted retention of the components.  He has had recurrent effusions despite this and presents now for resection arthroplasty.  PAST MEDICAL HISTORY:  Significant for osteoarthritis, rheumatoid arthritis, anxiety, and depression.  PAST SURGICAL HISTORY:  Significant for total knee replacement, shoulder surgery, and hemihepatectomy.  MEDICATIONS: 1. Prozac 20 mg one p.o. q.d. 2. Prilosec 20 mg one p.o. q.d. 3. Xanax 0.5 mg one p.o. q.d. 4. Voltaren 75 mg one b.i.d. 5. Prednisone 5 mg two q.d. 6. OxyContin one p.o. b.i.d.  ALLERGIES:  No known drug allergies.  SOCIAL HISTORY:  Patient denies alcohol use.  Denies current tobacco use, however, he was a smoker for 20 years and quit in 1991.  He is married and lives with his wife.  REVIEW OF SYSTEMS:  Unchanged.  PHYSICAL EXAMINATION  GENERAL:  Patient is a 70 year old white male who is alert, oriented, and cooperative to the examination.  VITAL SIGNS:  Pulse 94 and regular, respirations 14 and unlabored, blood pressure 113/90.  HEENT:  Normocephalic.  Pupils are equal, round and reactive to light and accommodation.  Nares are patent bilaterally.  Mucosa is pink and moist.  CHEST:  Clear to auscultation bilaterally.  No rales, rhonchi, strider, wheezes, or rubs  appreciated.  HEART:  Regular rate and rhythm.  Normal S1 and S2.  No murmurs, gallops, or rubs appreciated.  ABDOMEN:  Positive bowel sounds throughout.  Soft to palpation.  Nontender, nondistended.  No masses appreciated.  GENITOURINARY:  Not pertinent.  EXTREMITIES:  Neurovascularly intact bilaterally.  See office notes for further details.  IMPRESSION: 1. Infected left total knee arthroplasty. 2. History of rheumatic fever. 3. Gastroesophageal reflux disease. 4. History of renal calculi treated with lithotripsy. 5. Rheumatoid arthritis, steroid dependent.  PLAN:  Admit to [facility] on December 13, 2000 for a resection arthroplasty left knee with placement of antibiotic spacer to be done by Dr. Ollen Gross. DD:  01/16/01 TD:  01/16/01 Job: [id number]

## 2011-01-26 NOTE — Op Note (Signed)
[facility]  Patient:    TAMARION, HAYMOND                      MRN: [id number] Proc. Date: 10/09/00 Adm. Date:  [id number] Attending:  Ollen Gross V                           Operative Report  PREOPERATIVE DIAGNOSIS:  Hematoma with draining left total knee incision.  POSTOPERATIVE DIAGNOSIS:  Hematoma with draining left total knee incision.  PROCEDURE:  Irrigation and debridement of left knee with closure over drains.  SURGEON:  Ollen Gross, M.D.  ASSISTANT:  Alexzandrew L. Perkins, P.A.-C.  ANESTHESIA:  General.  ESTIMATED BLOOD LOSS:  100 cc.  DRAINS:  Hemovac x 1.  COMPLICATIONS:  None.  TOURNIQUET TIME:  27 min.  at 350 mmHg.  CONDITION:  Stable to recovery room.  INDICATIONS:  Lyndal is a 70 year old male who had left total knee arthroplasty repeat revision done in late November 2001.  It initially healed uneventfully, and then developed a hematoma.  He had underwent arthroscopic I&D and unfortunately the drain came out prematurely.  He has developed recurrent effusion and had a small area on the inferior part of his incision that had to be opened up.  At this point now he is taken to the operating room to undergo formalized I&D and formal culturing.  PROCEDURE IN DETAIL:  After successful administration of general anesthetic, he was placed on the left thigh and left lower extremity prepped and draped in the usual sterile fashion.  Extremity was then elevated and tourniquet inflated to 350 mmHg.  Previous incisions were utilized.  The skin was cut with the #10 blade and a small area that opened was excised.  The extensive mechanism was then cut with a fresh 10 blade, and hematoma is encountered.  A Gram stain C&S.  The overall appearance of the tissues looked fine. The scar tissue was excised.  There was no evidence of any purulence or necrotic tissue.  Then 3 L of saline were then irrigated through a pulsatile lavage, and another  liter with antibiotic solution, and then another 3 L with saline.  The joint looked fine.  It was flexed up and there was no evidence of any abnormal appearing tissue in the intercondylar notch.  Once the thorough debridement is completed, then the extensive mechanism is closed over a large bore Hemovac drain with #1 PDS.  Subcutaneous is closed with interrupted 2-0 Vicryl and the skin with staples.  The tourniquet was let down after the extensive mechanism was closed.  Minor bleeding was stopped with cautery. Once these were fully closed, then bulky sterile dressing was applied and drain hooked to suction.  The patient was awakened and transferred to the recovery room in stable condition. DD:  10/09/00 TD:  10/09/00 Job: [id number]

## 2011-01-26 NOTE — Discharge Summary (Signed)
NAMEODAI, WIMMER                         ACCOUNT NO.:  [id number]   MEDICAL RECORD NO.:  [id number]                   PATIENT TYPE:  INP   LOCATION:  0446                                 FACILITY:  [facility]   PHYSICIAN:  Ollen Gross, M.D.                 DATE OF BIRTH:  03/16/1941   DATE OF ADMISSION:  10/11/2003  DATE OF DISCHARGE:  10/14/2003                                 DISCHARGE SUMMARY   ADMISSION DIAGNOSES:  1. Significant postoperative pain secondary to hemarthrosis.  2. Status post left total knee replacement arthroplasty revision.  3. Anxiety.  4. Depression.  5. Hypertension.  6. Reflux disease.  7. History of ulcers.  8. Hiatal hernia.  9. Renal calculus.  10.      Rheumatoid arthritis.   DISCHARGE DIAGNOSES:  1. Hemarthrosis, left total knee, status post irrigation and debridement and     evacuation of hematoma.  2. Status post left total knee replacement arthroplasty revision.  3. Anxiety.  4. Depression.  5. Hypertension.  6. Reflux disease.  7. History of ulcers.  8. Hiatal hernia.  9. Renal calculus.  10.      Rheumatoid arthritis.   PROCEDURE:  Status post irrigation, debridement, and evacuation of hematoma,  left knee, on October 13, 2003.  Surgeon was Dr. Lequita Halt, assistant was Avel Peace, P.A.-C.  Anesthesia was general.  Minimal blood loss.  Hemovac  drain x1.   CONSULTATIONS:  None.   HISTORY:  Newt is a 70 year old male who had recently underwent a left  total knee arthroplasty revision approximately 12 days ago, did extremely  well, was placed on Coumadin.  Unfortunately, his INR elevated to 5.2.  He  came to the office with an intensely swollen knee.  He was admitted to the  hospital for anticoagulation reversal and prep for surgery.   LABORATORY DATA:  CBC preoperatively on admission:  Hemoglobin 10.9,  hematocrit 32.8, white cell count 15.1, red cell count 3.99.  Differential  showed elevated neutrophils of 86, decreased  lymphs of 8, monos 6,  eosinophils 0, basophils 0.  Postoperative H&H 9.9 and 30.8.  Sedimentation  rate elevated at 33.  PT and PTT on admission were 32.6 and 64,  respectively, with an elevated INR of 5.3.  Serial prothrombin times were  followed.  Last noted PT/INR normalized at 13.7 and 1.1.  Chem panel on  admission:  Slightly elevated glucose of 121, elevated BUN of 27, remaining  chem panel within normal limits.  C-reactive protein elevated at 2.8.   HOSPITAL COURSE:  The patient was admitted to [facility], placed  at bed rest.  Laboratory work was ordered along with CBC with diff, C-  reactive protein, and sedimentation rate.  Results as above.  Started back  on his pain medications and home medications.  He was given vitamin K for  reversal of his supratherapeutic INR for reversal  of the anticoagulation.  His INR had come back down on hospital day #2, to 1.7.  It was planned for  surgery the following day.  On October 13, 2003, his INR was re-checked,  down to 1.2.  He was taken to the operating room and underwent the above  stated procedure later that day without complications.  He tolerated the  procedure well.  Hemovac drain was placed at the time of surgery and he was  placed back on his meds.  On the following morning of October 14, 2003,  Hemovac was removed.  He was doing well.  Seen by Dr. Lequita Halt, feeling  better, and was discharged home.   DISCHARGE PLAN:  Discharged home on October 14, 2003.   DISCHARGE DIAGNOSES:  Please see above.   DISCHARGE MEDICATIONS:  1. Vicodin.  2. Ambien.  3. Aspirin for DVT prophylaxis.   ACTIVITY:  Weightbearing as tolerated.  Resume his current therapy.  Elevation, ice, and pain and swelling.   FOLLOWUP:  Follow up in two weeks from surgery, call the office for an  appointment at [phone].   DIET:  Resume previous home diet.   CONDITION ON DISCHARGE:  Improving.     Alexzandrew L. Julien Girt, P.A.              Ollen Gross, M.D.    ALP/MEDQ  D:  11/18/2003  T:  11/18/2003  Job:  [id number]   cc:   Areatha Keas, M.D.  [address]  [address]  [place]  [place] [address]  Fax: [phone]

## 2011-01-26 NOTE — H&P (Signed)
[facility]  Patient:    Patrick Tate, Patrick Tate Visit Number: [id number] MRN: [id number]          Service Type: SUR Location: 4W 0478 02 Attending Physician:  Loanne Drilling Dictated by:   Sammuel Cooper Mahar, P.A. Admit Date:  10/20/2001                           History and Physical  DATE OF BIRTH:  Left knee pain.  CHIEF COMPLAINT:  The patient is a 70 year old male who is very well known to [facility].  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  Currently with left knee pain in the tibial area.  Patrick Tate states that Patrick Tate feels it is loose again, and it is the worst it has felt in years.  Patrick Tate cannot do any activities of daily living.  His life is significantly affected and limited secondary to this pain and discomfort.  Patrick Tate is at the stage where Patrick Tate states Patrick Tate absolutely has to have something done because of the marked increased pain.  Patrick Tate is not currently having any infective symptoms, but Patrick Tate does remain on Keflex.  X-rays taken in the office on September 16, 2001, show that the femoral side of his knee replacement looks fine.  However, on the tibial side there is no bone stock left, and the long Press-Fit stem looks like it may be doing some stress shielding distally.  MEDICATIONS: 1. Prozac 20 mg, 1 p.o. b.i.d. 2. Prilosec 20 mg, 1 p.o. q.d. 3. Xanax 0.5 mg p.o. q.d. 4. Voltaren 75 mg, 1 p.o. b.i.d. 5. Prednisone 5 mg, 1 p.o. b.i.d. 6. OxyContin 20 mg, 1 p.o. b.i.d. 7. Keflex 500 mg, 1 p.o. t.i.d.  ALLERGIES:  None.  PAST MEDICAL HISTORY: 1. Osteoarthritis. 2. Rheumatoid arthritis. 3. Anxiety and depression. 4. Hiatal hernia.  PAST SURGICAL HISTORY: 1. Left total knee replacement with two revisions, status post infection. 2. Right total knee with one revision. 3. Bilateral  rotator cuff repairs. 4. Right biceps tendon rupture repair.  SOCIAL HISTORY:  The patient denies any alcohol use.  Patrick Tate denies current tobacco use; however, Patrick Tate was a smoker for 20 years and quit in 1991.  Patrick Tate is married and lives with his wife.  Patrick Tate has one grown son.  REVIEW OF SYSTEMS:  GENERAL:  The patient states that Patrick Tate has had some fever and chills and sweats a couple of weeks ago; however, these have since cleared.  CNS:  Denies any blurred vision, double vision, headaches, seizure, or paralysis.  CARDIOVASCULAR:  Denies any chest pain, angina, claudication, or palpitations.  PULMONARY:  Denies any shortness of breath, productive cough, or hemoptysis.  GASTROINTESTINAL:  Denies any nausea, vomiting, constipation, diarrhea, melena, or bloody stool.  GENITOURINARY:  Denies any dysuria, hematuria, or discharge.  MUSCULOSKELETAL:  Denies any paresthesias, numbness, or paralysis to his extremities.  PHYSICAL EXAMINATION:  VITAL SIGNS:  Blood pressure 130/88, respirations 14 and unlabored, pulse 84 and regular.  GENERAL:  The patient is a 70 year old white male.  Patrick Tate is alert and oriented. Patrick Tate is in no acute distress.  Patrick Tate is well-nourished, well-groomed.  Appears his stated age.  Patrick Tate is pleasant and cooperative to exam.  HEENT:  Head normocephalic, atraumatic.  Pupils are equal, round, and reactive to light.  Extraocular movements intact.  Nares patent bilaterally.  Pharynx is clear, without any erythema or exudate.  NECK:  Soft and supple to palpation.  No bruits appreciated.  No lymphadenopathy noted.  No thyromegaly noted.  CHEST:  Clear to auscultation bilaterally.  No rales, rhonchi, stridor, wheezes, or friction rubs.  BREASTS:  Not pertinent, not performed.  HEART:  Regular S1, S2.  Regular rate and rhythm.  No murmurs, gallops, or rubs noted.  ABDOMEN:  Soft and supple to palpation.  Positive bowel sounds throughout. Nontender, nondistended.  No organomegaly  noted.  GENITOURINARY:  Not pertinent, not performed.  EXTREMITIES:  Left knee range of motion is limited secondary to discomfort. It is approximately 5-130 degrees but somewhat uncomfortable at the extremes. There is no instability noted.  It is tender in the pretibial area.  Distal pulses are intact and equal bilaterally.  Sensation is intact and equal throughout bilateral lower extremities.  SKIN:  Intact.  Without any lesions or rashes.  LABORATORY DATA:  X-ray shows some stress shielding at the end of the long Press-Fit stem on the tibial side, and it also appears to have no bone stock left.  IMPRESSION: 1. Left knee loose tibial component. 2. Hiatal hernia. 3. Rheumatoid arthritis. 4. History of anxiety and depression.  PLAN:  Admit to [facility] on October 20, 2001, for a left total knee revision.  This will be done by Dr. Homero Fellers Aluisio.  PRIMARY CARE PHYSICIAN:  Dr. Phylliss Bob. Dictated by:   Sammuel Cooper. Mahar, P.A. Attending Physician:  Loanne Drilling DD:  10/16/01 TD:  10/16/01 Job: [id number]

## 2011-01-26 NOTE — Op Note (Signed)
[facility]  Patient:    Patrick Tate, Patrick Tate Visit Number: [id number] MRN: [id number]          Service Type: SUR Location: 4W 0481 01 Attending Physician:  Loanne Drilling Dictated by:   Ollen Gross, M.D. Proc. 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  PROCEDURE:  Left patellectomy.  SURGEON:  Ollen Gross, M.D.  ASSISTANT:  Clarene Reamer, [facility].  ANESTHESIA:  General.  ESTIMATED BLOOD LOSS:  Minimal.  DRAIN:  Hemovac 1.  COMPLICATIONS:  None.  TOURNIQUET TIME:  Approximately 30 minutes at 350 mmHg.  BRIEF CLINICAL NOTE:  [name] is a 70 year old male with a very complicated history in regards to his left. He has had several total knee arthroplasty revisions He had one bout of infection approximately a year ago. That appears to have subsided and cured with his two-stage revision. He recently had a tibial revision back in January of 2003. He has always had some patellofemoral symptoms but now has definitely fragmentation, osteonecrosis, and worsening anterior knee pain. Again, with the fact that he does not have a patellar prosthesis, it is decided just to go ahead and perform a patellectomy. He subsequently comes today for that procedure.  PROCEDURE IN DETAIL:  After successful administration of general anesthetic, tourniquet is placed high on the left thigh, left lower extremity prepped and draped in the usual sterile fashion. Extremity is wrapped in Esmarch, knee flexed, tourniquet inflated to 350 mmHg. The previous incisions were utilized. Skin was cut with a #10 blade subcutaneous tissue the patient the level of the extensor mechanism. A fresh blade is used to make a new parapatellar arthrotomy. He  had a lot of effusion and fluid sent for Gram stain and C&S. He also had some fibrinoid type debris in there consistent with an organizing hematoma. This tissue was also removed and sent for Gram stain and C&S. Joint is then thoroughly irrigated and all abnormal tissue is removed. The patella is identified; there is some fragmentation. The patella is removed with subperiosteal dissection. There is a tremendous amount of thickened scar in the infrapatellar area and this excised. I then excised the edges of the tendon, both medially and laterally, and we was able to do a medial advancement. He had sufficient thickness at the tendon to allow for a normal extensor mechanism. After all the thorough debridement is performed and the arthrotomy is closed over Hemovac drain with interrupted #1 PDS. Subcutaneous is closed with interrupted 2-0 Vicryl and tourniquet is released; total time approximately 30 minutes. The skin is closed with staples. A bulky sterile dressing is applied. Prior to doing the subcutaneous closure, I flexed the knee against gravity and 130 degrees without any tension ion the extensor mechanism. Once the bulky sterile dressing is applied then he is placed to a knee immobilizer where he can transport to recovery in stable condition . Dictated by:   Ollen Gross, M.D. Attending Physician:  Loanne Drilling DD:  01/26/02 TD:  01/26/02 Job: [id number]

## 2011-01-26 NOTE — Op Note (Signed)
NAMEENZIO, BUCHLER NO.:  [id number]   MEDICAL RECORD NO.:  [id number]          PATIENT TYPE:  INP   LOCATION:  0006                         FACILITY:  [facility]   PHYSICIAN:  Ollen Gross, M.D.    DATE OF BIRTH:  1941-06-19   DATE OF PROCEDURE:  09/06/2004  DATE OF DISCHARGE:                                 OPERATIVE REPORT   PREOPERATIVE DIAGNOSIS:  Failed left total knee arthroplasty revision.   POSTOPERATIVE DIAGNOSIS:  Failed left total knee arthroplasty revision.   PROCEDURE:  Left above-the-knee amputation.   SURGEON:  Ollen Gross, M.D.   ASSISTANT:  Avel Peace, PA-C.   ANESTHESIA:  General.   ESTIMATED BLOOD LOSS:  Less than 200.   DRAINS:  Hemovac x1.   COMPLICATIONS:  None.   CONDITION:  Stable to the recovery room.   CLINICAL NOTE:  Patrick Tate is a 70 year old male with a long history of multiple  problems related to his left knee.  He had a primary total knee arthroplasty  and subsequent revision performed at [facility], and for the past 3-4 years, I  have been taking care of him.  We have had revisions with subsequent  infection requiring two-stage re-revision and then another subsequent  revision for loosening.  He also had instability, and he was revised to a  constrained, hinged implant approximately a year ago.  He did fairly well  initially.  He has had some wound healing problems, but cultures were always  negative.  He has gone on to intractable pain with this.  Given that, he is  already at the most constrained type of implant.  We decided that the most  appropriate means of dealing with this situation would be to proceed to an  above-the-knee amputation, as there were no other reconstructive options  available.  After a long discussion and a long interval of attempting  further nonoperative management, we decided to proceed with above-the-knee  amputation.   PROCEDURE IN DETAIL:  After successful administration of general  anesthetic,  the perineum is isolated from his left lower extremity with plastic drapes,  and then the left lower extremity is prepped and draped in the usual sterile  fashion.  We then placed a sterile tourniquet high on the left thigh.  The  leg is elevated for approximately a minute, and the tourniquet is then  inflated to 300 mmHg.  About four fingerbreadths above the joint line, we  drew the incision line, and with a fish-mouth configuration in the distal-  most portion being at that level, four fingerbreadths above the joint line.  The skin is cut with a 10 blade through the subcutaneous tissue to the  superficial fascia, which is incised with the skin.  The saphenous vein is  identified and cauterized.  The medial compartment musculature is then  dissected through to get to the neurovascular bundle.  The vein and artery  are isolated and ligated, both proximal and distal.  Then 2-0 silk was used  to ligate.  Other small vessels are also ligated.  We went through posterior  and lateral musculature  also, as well as the anterior musculature.  Once the  soft tissues were dissected completely and circumferentially.  Then we  marked our site for bone resection.  The sciatic nerve had also been  isolated, identified, and cut with a knife to retract up into the soft  tissues.  The bone resection is then made with an oscillating saw.  The  patient's prior femoral stem is encountered in the canal.  We  circumferentially cut around the stem and then pulled the stem out of his  femoral canal.  The leg is then sent as a pathologic specimen.  The  tourniquet is then released for a total time of 33 minutes.  Other minor  bleeding is stopped with cautery.  The wound is copiously irrigated.  The  posterior tissue is advanced anteriorly, and the bone is covered by closing  the muscle fascia and the fascia with interrupted #1 Vicryl.  A Hemovac  drain had also been placed.  I injected 20 cc of 0.25%  Marcaine with  epinephrine adjacent to the sciatic nerve.  The subcu tissues are then  closed with interrupted 2-0 Vicryl and then skin with staples.  A drain is  hooked to suction.  A bulky sterile dressing is applied.  He is put into a  plaster splint.  He is then awakened and transported to recovery in stable  condition.     Drenda Freeze   FA/MEDQ  D:  09/06/2004  T:  09/06/2004  Job:  [id number]

## 2011-01-26 NOTE — Op Note (Signed)
[facility]  Patient:    Patrick Tate, Patrick Tate                         MRN: [id number] Proc. Date: 01/27/01 Attending:  Ollen Gross, M.D.                           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  PROCEDURE:  Irrigation and debridement left knee with reimplantation of left total knee prosthesis.  SURGEON:  Dr. Lequita Halt.  ASSISTANT:  Dr. Ronnell Guadalajara.  ANESTHESIA:  General.  ESTIMATED BLOOD LOSS:  100  DRAINS:  Hemovac x 1.  TOURNIQUET TIME:  Up 46 minutes at 350 mmHg and then down 8 minutes and then up another 44 minutes at 350.  COMPLICATIONS:  None.  CONDITION:  Stable to recovery.  BRIEF CLINICAL NOTE:  Patrick Tate is a 70 year old male who had infected revision treated with resection arthroplasty six years ago. There was a staff species sensitive to first generation cephalosporins and received four weeks of intravenous Ancef, two weeks of p.o. Keflex and labs normalized. He presents now for I&D and reimplantation of his total knee.  DESCRIPTION OF PROCEDURE:  After successful administration of general anesthetic, a tourniquet was placed high on the left thigh and left lower extremity prepped and draped in the usual sterile fashion. Previous incisions were utilized, skin cut with a 10 blade in subcutaneous tissue and subcu flaps are elevated. Medial parapatellar arthrotomy is made and some fluid identified and sent for staph, Gram stain, C&S. The Gram stain showed no organisms and no white cells. A quad snip is then performed as it had to be utilized previously for the last surgery to avert the patella. The cement spacer is then removed and any hematoma-like tissue is then excised. The wound is copiously irrigated and then tissue from the femoral canal as well as tibial canal is sent  for frozen section. Femorals negative. On the tibial canal, there was one small foci of neutrophils otherwise normal. The IM canals are then reamed, femoral side up to 20 mm, tibial side to 16. I went to a depth of 115 tibial side and 125 on the femoral side. The intermedullary rod is then placed on the tibial side with a 16 mm stem and then the cutting block placed for a three degree posterior slope. I used it to resect basically just the surface of the tibia. Other section is then made. The stem is removed. Given his large bone defect in order to build his joint line back up to normal, I used a 15 mm platform. A trial 15 mm platform with a 16 x 115 stem and size 3 tibia with medial offset is then placed with excellent fit. The 20 mm dummy stem is then placed into the femoral canal and the 5 degree left valgus guide placed just to skim the distal femur. He had a large distal femoral defect and has decided to put 8 mm distal augments in order to build the joint line back towards normal. This will prevent significant patella baja. The AP block is then placed in the plus 2 position to lower the block towards the anterior cortex for a size 4 femur. A 15 mm spacer block is placed with the knee in 90 degrees of flexion and the  rotation is gaged from there. The rotation corresponds to the epicondylar axis. Blocks pinned and the 8 mm augment needed to be placed posteromedially, 4 mm posterolaterally after the surfaces are cut. The chamfer block is placed and the intercondylar chamfer cuts then made off of the stem. All stems removed and the trials are placed. The tibial trial already was in place, the femoral trial was a stem of 20 x 125 with a size 4 femur, 8 mm distal augments medially and laterally and then an 8 mm posteromedial and 4 mm posterolateral augment. Both trials on the femoral and tibial side had excellent fit. The 20 mm spacer is first placed and there was some varus valgus first  placed and there was some varus valgus plane hyperextension. I left a 25 mm posterior stabilized TC3 insert and he had great balance in full extension as well as past 100 degrees of flexion. At this point, the trial is removed, tourniquet released and there is no evidence of any bleeders posteriorly. Any other abnormal appearing tissue was then debrided. The tourniquet was down for a total time of 8 minutes while the implants were being assembled on the back table. Once the implants were assembled and the legs again wrapped in Esmarch and tourniquet reinflated 350, three bags of Palacos cement with two vials of vancomycin are mixed together. Once ready for implantation, first the tibial component is placed and impacted with just being cemented proximally and Press-Fit stem distally. Similarly with the femur, it was cemented distally, Press-Fit stem proximally in the canal. A 25 mm stabilized trial was placed, knee held in full extension, all extruded cement removed. Once the cement is hardened and a permanent 25 mm posterior stabilized TC3 insert is impacted into a size 3 tibial tray. The knee is again reduced. Nothing was done with the patella as it was just basically a bony shell and had previously not been resurfaced with these other revisions. The wound is then copiously irrigated with pulsatile lavage antibiotic solution and the extensor mechanism and quadriceps snip closed over a Hemovac drain with interrupted #1 PDS, subcu closed with interrupted 2-0 Vicryl and skin with staples. Drains hooked to suction, a bulky sterile dressing applied, tourniquet released and he was placed into a knee immobilizer, awakened and transported to recovery in stable condition. DD:  01/27/01 TD:  01/28/01 Job: [id number]

## 2011-01-26 NOTE — H&P (Signed)
NAMENIKOLAJ, GERAGHTY NO.:  [id number]   MEDICAL RECORD NO.:  [id number]          PATIENT TYPE:  INP   LOCATION:  NA                           FACILITY:  [facility]   PHYSICIAN:  Ollen Gross, M.D.    DATE OF BIRTH:  1941-04-05   DATE OF ADMISSION:  09/06/2004  DATE OF DISCHARGE:                                HISTORY & PHYSICAL   DATE OF OFFICE VISIT HISTORY AND PHYSICAL:  August 29, 2004   CHIEF COMPLAINT:  Ongoing chronic leg pain.   HISTORY OF PRESENT ILLNESS:  Matther is a 70 year old male, who is extremely  well known to [facility] and Dr. Ollen Gross, having had  left total knee arthroplasty in the past and has undergone multiple  procedures including at least six subsequent revisions.  He has had multiple  problems and continued pain.  Unfortunately with his last surgery, he has  continued with a chronic infection with the left knee.  We have been trying  to treat this with multiple local I&D's and antibiotics; however, he  continues to have chronic pain and also chronic infection with the knee.  Again, this has been ongoing for quite some time now.  The patient is quite  depressed with all of the ongoing issues, and all surgical options have been  exhausted.  It is felt that the patient would benefit from undergoing an  above-knee amputation with removal of the infected prosthesis.  This type of  procedure has been discussed at length by Dr. Lequita Halt with the patient.  The  patient has elected to proceed with surgery.   ALLERGIES:  No known drug allergies.   CURRENT MEDICATIONS:  Prozac, Prilosec, Micardis, OxyContin b.i.d.,  prednisone, Pravachol, Xanax, Voltaren, Percocet.   PAST MEDICAL HISTORY:  1.  Recent history of a supratherapeutic INR with Coumadinization back in      January 2005 with an INR of 5.3.  2.  Anxiety.  3.  Depression.  4.  Past history of ulcer.  5.  Renal calculi.  6.  Hiatal hernia.  7.  Reflux disease.  8.  Hypertension.   PAST SURGICAL HISTORY:  1.  Left total knee arthroplasty with six subsequent revisions, most      recently in January.  He has undergone at least 24 surgeries on his      knee.  2.  Bilateral rotator cuff repair.  3.  Biceps tendon repair.  4.  Left leg I&D in September 2005.   SOCIAL HISTORY:  Married, one son, nonsmoker, no alcohol.   FAMILY HISTORY:  Father deceased age 22 with a history of COPD and smoking.   REVIEW OF SYSTEMS:  GENERAL:  No fevers or chills, but he does have hot  flashes occasionally which he attributes to a pain pill.  NEUROLOGIC:  No  seizures, syncope, paralysis.  RESPIRATORY:  No shortness of breath,  productive cough, or hemoptysis.  CARDIOVASCULAR:  No chest pain, angina, or  orthopnea.  GI:  No nausea, vomiting, diarrhea, or constipation.  GU:  No  dysuria, hematuria, or discharge.  MUSCULOSKELETAL:  Left leg, found in the  history of present illness.   PHYSICAL EXAMINATION:  VITAL SIGNS:  Pulse 88, respirations 14, blood  pressure 112/64.  GENERAL:  The patient is a 89 white male, well-nourished, well-developed, no  acute distress.  He is alert, oriented, and cooperative.  HEENT:  Normocephalic, atraumatic.  Pupils are round and reactive.  Oropharynx clear.  EOMs are intact.  NECK:  Supple.  Trace carotid bruit versus referred murmur from the chest.  HEART:  Regular rhythm with a grade 2/6 systolic ejection murmur best heard  over pulmonic point.  ABDOMEN:  Soft, nontender, bowel sounds are present.  GU/RECTAL/BREASTS/GENITALIA:  Not done.  Not pertinent to present illness.  EXTREMITIES:  Significant to the left leg.  The anterior knee incision is  well-healed.  Motor function is intact.  Moderate pain globally throughout  the left knee and left previous surgery.   IMPRESSION:  1.  Chronically infected left total knee.  2.  Status post left total knee replacement arthroplasty with multiple      revisions and multiple  surgeries.  3.  Anxiety.  4.  Depression.  5.  Hypertension.  6.  Reflux disease.  7.  Past history of ulcers.  8.  History of renal calculi.  9.  Hiatal hernia.   PLAN:  The patient will be admitted to [facility] to undergo a  left above-knee amputation per Dr. Ollen Gross.  The patient's medical  physician is Dr. Phylliss Bob.  Dr. Phylliss Bob will be notified and consulted if needed  for medical assistance with the patient throughout the hospital course.     Alex   ALP/MEDQ  D:  09/05/2004  T:  09/05/2004  Job:  [id number]   cc:   Ollen Gross, M.D.  [facility]  [address]  [address]  [place]  [place] [address]  Fax: [phone]   Areatha Keas, M.D.  [address]  [address]  [place]  [place] [address]  Fax: [phone]

## 2011-01-26 NOTE — Op Note (Signed)
[name], [name]                         ACCOUNT NO.:  [id number]   MEDICAL RECORD NO.:  [id number]                   PATIENT TYPE:  INP   LOCATION:  0446                                 FACILITY:  [facility]   PHYSICIAN:  Ollen Gross, M.D.                 DATE OF BIRTH:  [birth year]   DATE OF PROCEDURE:  10/13/2003  DATE OF DISCHARGE:                                 OPERATIVE REPORT   PREOPERATIVE DIAGNOSIS:  Hemarthrosis left total knee.   POSTOPERATIVE DIAGNOSIS:  Hemarthrosis left total knee.   PROCEDURE:  Irrigation, debridement and evacuation of hematoma left knee.   SURGEON:  Ollen Gross, M.D.   ASSISTANT:  Alexzandrew L. Julien Girt, P.A.   ANESTHESIA:  General.   ESTIMATED BLOOD LOSS:  Minimal.   DRAINS:  Hemovac x1.   COMPLICATIONS:  None.   CONDITION:  Stable to recovery.   BRIEF CLINICAL NOTE:  Patrick Tate is a 70 year old male who had a left total knee  arthroplasty revision approximately 12 days ago. He did extremely well  initially postoperatively and was on Coumadin for DVT prophylaxis.  He had  elevated INR to 5.2 and came into the office Monday with a tensely swollen  knee. He was admitted to the hospital where his anticoagulation reversed and  he reports to the operating room today for evacuation of hematoma and I&D of  the knee.   DESCRIPTION OF PROCEDURE:  After successful administration of general  anesthetic, a tourniquet was placed high on the left thigh, left lower  extremity prepped and draped in the usual sterile fashion.  I opened the  middle aspect of his incision with a 10 blade cutting through the  subcutaneous tissue and there was a tremendous subcu hematoma.  This was  evacuated and the tissue itself looked excellent. We then made the medial  arthrotomy removing all the old sutures and encountered hematoma in the  joint.  This was evacuated, there was no evidence of any abnormal appearing  tissue or abnormal appearing fluid.  We then  thoroughly irrigated the fluid  with 3 liters of saline solution using the pulsatile lavage. I then placed  antibiotic solution in the subcutaneous tissues.  A Hemovac  drain is then placed and the arthrotomy closed with interrupted #1 PDS,  subcu closed with interrupted 2-0 Vicryl and skin with staples. A bulky  sterile dressing is applied, drain hooked to suction.  He was placed into a  knee immobilizer, awakened and transported to recovery in stable condition.                                               Ollen Gross, M.D.    FA/MEDQ  D:  10/13/2003  T:  10/13/2003  Job:  [id number]

## 2011-01-26 NOTE — H&P (Signed)
NAME:  Patrick Tate, Patrick Tate                         ACCOUNT NO.:  [id number]   MEDICAL RECORD NO.:  [id number]                   PATIENT TYPE:  INP   LOCATION:  NA                                   FACILITY:  [facility]   PHYSICIAN:  Ollen Gross, M.D.                 DATE OF BIRTH:  08-15-1941   DATE OF ADMISSION:  10/01/2003  DATE OF DISCHARGE:                                HISTORY & PHYSICAL   CHIEF COMPLAINT:  Left knee pain.   HISTORY OF PRESENT ILLNESS:  The patient is a 70 year old male well known to  Dr. Homero Fellers Aluisio. He has previously undergone multiple knee surgeries in  the past. He has had a total knee with four subsequent revisions. He has had  continued pain to the point now where it has gotten quite severe to where it  is causing him to have some nausea. He has not had any infectious type  symptoms. No fever, chills, or swelling. The knee has given out on him all  the time now. He is seen in the office where he has found to have some  global instability. He is very tender along the anterior shin. He feels like  there is no way to continue because of his instability. The significant  situation is discussed at length with the patient by Dr. Lequita Halt. It is felt  the only option would be converting over to a hinged knee replacement with  long stems undergoing another revision. The risks and benefits of this  procedure have been discussed with the patient and he elects to proceed with  surgery.   ALLERGIES:  No known drug allergies.   CURRENT MEDICATIONS:  1. Voltaren 75 mg b.i.d.  2. Prozac 20 mg q.a.m.  3. Prilosec 20 mg p.o. q.a.m.  4. Micardis 80 mg p.o. q.a.m.  5. OxyContin 20 mg t.i.d.  6. Prednisone 5 mg, two in the a.m.  7. Pravachol 40 mg q.p.m.  8. Xanax 0.5 mg one in the morning and one in the evening.  9. Centrum silver daily.  10.      Ecotrin 325 mg daily.   PAST MEDICAL HISTORY:  1. Anxiety and depression.  2. Hiatal hernia.  3. Reflux disease.  4.  Hypertension.  5. Rheumatoid arthritis.  6. History of renal calculi.   PAST SURGICAL HISTORY:  Left total knee replacement arthroplasty with four  revisions. He has undergone at least 23 surgeries on his knees since 1991.  He has also had bilateral rotator cuff repair and right biceps tendon  repair.   SOCIAL HISTORY:  Married, one son. He is not a smoker. No alcohol.   FAMILY HISTORY:  Father deceased at age 19.  History of COPD and smoking.   REVIEW OF SYSTEMS:  GENERAL: No fever, chills, nightsweats. NEUROLOGIC: No  seizure, syncope, or paralysis. RESPIRATORY:  No shortness of breath,  productive cough, or hemoptysis. CARDIOVASCULAR:  No chest pain, angina, or  orthopnea. GI: No nausea, vomiting, diarrhea, or constipation. GU: No  dysuria, hematuria, or discharge. MUSCULOSKELETAL: Pertinent to that of the  knee found in the history of present illness.   PHYSICAL EXAMINATION:  VITAL SIGNS: Pulse 78, respirations 12, blood  pressure 102/64.  GENERAL: The patient is a 70 year old white male, well-developed, well-  nourished, in no acute distress.  He is alert and oriented, and cooperative.  HEENT:  Normocephalic and atraumatic. Pupils are round and reactive. EOMs  are intact.  NECK: Supple. There is a trace right carotid bruit versus referred murmur  from the chest.  CHEST: Clear to auscultation in the anterior and posterior chest wall. No  rales, rhonchi, or wheezes.  HEART: Regular rate and rhythm with a grade 2/6 systolic ejection murmur  noted, best over the pulmonary point. ABDOMEN: Soft, nontender. Bowel sounds  are present.  RECTAL/BREASTS/GENITALIA: Not done; not pertinent to the present illness.  EXTREMITIES: Significant to left lower extremity, anterior knee incisions  are healed well. He does have global instability with the knee at flexion  and extension. Range of motion of 5 to 135 degrees. He is tender along the  anterior tibia.   IMPRESSION:  1. Loose unstable  left total knee.  2. Anxiety.  3. Depression.  4. Hypertension.  5. Reflux disease.  6. History of ulcers.  7. Hiatal hernia.  8. Renal calculi.   PLAN:  The patient will be admitted to [facility] and undergo a  revision. Left total knee surgery will be performed by Dr. Ollen Gross.     Alexzandrew L. Julien Girt, P.A.              Ollen Gross, M.D.    ALP/MEDQ  D:  09/30/2003  T:  10/01/2003  Job:  [id number]

## 2011-01-26 NOTE — Discharge Summary (Signed)
NAMEBRANNAN, CASSEDY NO.:  [id number]   MEDICAL RECORD NO.:  [id number]          PATIENT TYPE:  INP   LOCATION:  0463                         FACILITY:  [facility]   PHYSICIAN:  Ollen Gross, M.D.    DATE OF BIRTH:  23-Jun-1941   DATE OF ADMISSION:  09/06/2004  DATE OF DISCHARGE:  09/09/2004                                 DISCHARGE SUMMARY   ADMITTING DIAGNOSES:  1.  Chronically infected left total knee.  2.  Status post left total knee replacement arthroplasty with multiple      revisions and multiple surgeries.  3.  Anxiety.  4.  Depression.  5.  Hypertension.  6.  Reflux disease.  7.  Past history of ulcers.  8.  History of renal calculi.  9.  Hiatal hernia.   DISCHARGE DIAGNOSES:  1.  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  2.  Status post left total knee replacement arthroplasty with multiple      visions and multiple surgeries.  3.  Anxiety.  4.  Depression.  5.  Hypertension.  6.  Reflux disease.  7.  Past history of ulcers.  8.  History of renal calculi.  9.  Hiatal hernia.  10. Mild postoperative anemia.   PROCEDURE:  On 09/06/04, left above-knee amputation.   SURGEON:  Ollen Gross, M.D.   ASSISTANT:  Alexzandrew L. Julien Girt, P.A.   ANESTHESIA:  General.   ESTIMATED BLOOD LOSS:  Less than 200 cc.   DRAINS:  Hemovac drain x1.   HISTORY OF PRESENT ILLNESS:  Deakin is a 70 year old male with a long-  standing history of multiple problems related to his left knee.  He had a  primary knee arthroplasty and subsequent revision performed at [name].  For  the past 3-4 years, he has been under the care of Dr. Homero Fellers Aluisio.  He has  had to undergo revisions with subsequent infection requiring a two-stage  revision and then another subsequent revision for loosening.  He has also  had instability to revise to a constrained hinge implant about a year ago.  He initially did fairly well but developed  some wound healing problems which  later led to intractable pain.  Given that he is already on the most  constrained type of implant, we decided the most prudent means of dealing  with the situation would be to proceed with above-knee amputation.  There  were no other reconstructive options available.  Risks and benefits were  discussed and he was subsequently admitted to the hospital.   LABORATORY DATA:  CBC preop:  Hemoglobin 11.0, hematocrit 34.0, elevated  white count 14.9, differential elevated neutrophils 89, lymphs 7, monos 3,  eos 0, baso 0.  Postop hemoglobin 9.7, last noted H&H 9.2 and 28.5.  PT/PTT  preop 12.2 and 25, respectively.  INR 0.9.  Chemistry panel on admission:  Elevated potassium at 5.2, elevated BUN 34, elevated creatinine 2.3, low  albumin 3.3.  Serial BMETs were followed.  Sodium dropped more than 41 down  to 133, potassium dropped 5.2  down to 4.4, creatinine dropped from 2.3 to  1.8 and back up to 2.0, BUN dropped from 34 to 22.  Urinalysis preop  negative.   EKG dated August 29, 2004:  Normal sinus rhythm, moderate voltage criteria  for LVH, may be normal variant, possible inferior infarct age undetermined  since October 02, 2003.  Inferior Q waves are somewhat more apparent.  Confirmed by Dr. Corliss Marcus.   HOSPITAL COURSE:  The patient was admitted to [facility] and taken  to the OR and underwent the above-stated procedure without complication.  The patient tolerated the procedure well, later sent to the recovery room  and the orthopedic floor.  He was seen on day 1 by Dr. Lequita Halt and said he  had felt the best he has felt in almost 3 months.  Briefly went over his  surgery.  Hemovac drain which was placed at the time of surgery was pulled.  He started getting up to the chair with physical therapy.  Discharge  planning was consulted to arrange for appropriate equipment and in-home  needs.  By day 2, had a little bit of a temperature, encouraged  incentive  spirometer.  Dressing was changed.  Incision was clean.  There were no signs  of infection.  He denied any type of phantom leg pain.  PCA and IVs were  discontinued.  Ordered a stump shrinker from [employer].  By day 3, he  continued to improve.  He was moving his bowels, felt much better.  The  stump looked fantastic.  He was seen in rounds by Dr. Lequita Halt and was  discharged home.   DISCHARGE PLAN:  1.  The patient was discharged home on September 09, 2004.  2.  Discharge diagnoses:  Please see above.  3.  Discharge medication:  Percocet p.r.n. pain.  4.  Activity:  May be up as tolerated with a wheelchair or with crutches.      [facility] for [facility] PT/home health nursing.  5.  Followup in the office on September 19, 2004.  Call the office for an      appointment, at which time staples will be removed.   DISPOSITION:  Home.   CONDITION ON DISCHARGE:  Improving.      ALP/MEDQ  D:  10/18/2004  T:  10/18/2004  Job:  [id number]   cc:   Areatha Keas, M.D.  [address]  [address]  [place]  [place] [address]  Fax: [phone]

## 2011-01-26 NOTE — Op Note (Signed)
NAME:  Patrick Tate, Patrick Tate                         ACCOUNT NO.:  [id number]   MEDICAL RECORD NO.:  [id number]                   PATIENT TYPE:  INP   LOCATION:  0006                                 FACILITY:  [facility]   PHYSICIAN:  Ollen Gross, M.D.                 DATE OF BIRTH:  [birth year]   DATE OF PROCEDURE:  10/01/2003  DATE OF DISCHARGE:                                 OPERATIVE REPORT   PREOPERATIVE DIAGNOSIS:  Failed left total knee arthroplasty.   POSTOPERATIVE DIAGNOSIS:  Failed left total knee arthroplasty.   PROCEDURE:  Left total knee arthroplasty revision.   SURGEON:  Ollen Gross, M.D.   ASSISTANT:  Alexzandrew L. Julien Girt, P.A.   ANESTHESIA:  General.   ESTIMATED BLOOD LOSS:  500   DRAINS:  Hemovac x1.   COMPLICATIONS:  None.   TOURNIQUET TIME:  Up 81 minutes at 300 mmHg, down approximately 20 minutes  and up 19 at 300 mmHg.   COMPLICATIONS:  None.   CONDITION:  Stable to recovery.   BRIEF CLINICAL NOTE:  Patrick Tate is a 70 year old male who has had multiple left  knee surgeries most recently a total knee arthroplasty revision  approximately two years ago. For the past 3 or 4 months, he has had  progressively worsening pain with evidence of loosening.  He had options of  revising this to a hinged implant versus above knee amputation. He presents  now for attempted revision to a hinged implant.   DESCRIPTION OF PROCEDURE:  After successful administration of general  anesthetic, a tourniquet was placed high on the left thigh, left lower  extremity prepped and draped in the usual sterile fashion. Extremity was  wrapped in esmarch, tourniquet inflated to 300 mmHg.  A midline incision was  made, carried down approximately to the mid tibial level. The skin was cut  with a 10 blade, subcutaneous tissue dissected medially. A fresh blade was  used to make a medial parapatellar arthrotomy.  Given that we were going to  resect the entire proximal tibia, I did a tibial  tubercle osteotomy and  everted the soft tissue sleeve laterally.  He has had a previous  patellectomy.  I was able to easily remove the femoral component as it was  grossly loose.  There was no abnormal appearing fluid and the tissue did not  have an infectious appearance. In his previous surgeries, he had a  significant amount of fluid in the joint and this had a much healthier  appearance at this time. We subsequently removed the tibial component.  There was evidence that this was all mechanical failure due to lack of bone  stock on both femoral and tibial sides.  I repaired the femur first by  reaming up to 15 mm with a 15 mm stem x100 mm length.  We then broached up  to a 40 mm broach. We subsequently put the cutting guide on  the broach trial  and did our distal cut. We then placed a block to the anterior, posterior  and chamfer cuts and subsequently for the box cut.  We then placed together  the trial femur with the 15 x 100 mm stem, 40 mm broach sleeve and medium  femoral rotating hinge component.  This had a good fit on the bony surface.  Once this was completed, we skeletonized the proximal tibia subperiosteally  removing all tissue circumferentially down to the level we had templated for  resection. We decided on this level by placing the LPS tibial prosthesis  tibia so that it would articulate with the femoral component and then  applied tension to the tissue and subsequently marked the tibia for  resection at that level. We resected the tibia with an oscillating saw and  removed about 100 mm or 10 cm of the proximal tibia.  I subsequently removed  the cement within the tibial canal as well as removing the cement plug. We  thoroughly irrigated and then reamed up to 11.5 mm to place a 9 mm cemented  stem.  The calcar planar is then passed over the cut tibial surface to  smooth that out. We placed the trial proximal tibial extra small component  with the 9 x 100 stem extension and  placed it up to a 23 mm insert with a  hinge in there. He had full extension and flexion down to 130 degrees.  It  was decided that these would be the appropriate components and subsequently  released the tourniquet with a total time of 81 minutes. We checked for  bleeding and there appeared to be a small rent in one of the branches off  the tibial artery.  We subsequently repaired that with 6-0 Prolene suture. I  had spoken to vascular surgery, Dr. Edilia Bo, who recommended attempting to  repair that and also placing Gelfoam with thrombin spray. After we had  followed his recommendations all the bleeding had stopped and there was  still visible pulsation of vessels both proximal and distal to where that  small rent was repaired.  After that was completed, then we assembled all of  the components on the back table once again consisting of the medium left  rotating hinge femoral component with the 40 mm femoral broached sleeve and  the 15 x 100 stem extension.  Once we impacted those together, we put  together the proximal tibial component for the LPS which was an extra small  and attached the 9 x 100 cemented stem to that.  We placed a size 3 cement  plug at the appropriate level in the tibial canal. The cut bone surfaces  were then prepared with pulsatile lavage.  Once again the tourniquet had  been reinflated.  The cement is then mixed consisting of three batches of  cement with 2 g of vancomycin.  The tibial component is subsequently  cemented down into the tibia with an excellent fit. The femoral component is  cemented at the bone prosthesis interface and press fit into the canal with  the broach. We placed a 23 trial insert, held the knee in full extension,  removed all the extruded cement.  Once the cement was fully hardened then we  placed the permanent 23 mm hinged rotating polyethylene insert and  subsequently placed the locking pin into the femoral component.  His flexion against  gravity was 135 degrees.  The wound is copiously irrigated with  antibiotic solution.  We then reattached the patellar tendon to the  prosthesis with #1 fiber wire and #1 Ethibond through the appropriate holes  in the prosthesis. It flexed against gravity and he achieved 130 degrees of  flexion without any excess tension or pulling off of the tendon.  We then  repaired the medial arthrotomy with interrupted Vicryl at the ___________  end, PDS suture over a Hemovac drain.  The subcu was closed with interrupted  2-0 Vicryl and skin with staples. The Hemovac was then hooked to suction.  We placed a bulky sterile dressing, took down the drapes and I was able to  palpate a 2+ dorsalis pedal and posterior tibial pulse.  We then placed a  bulky sterile dressing and he was awakened and transported to recovery in  stable condition.                                               Ollen Gross, M.D.    FA/MEDQ  D:  10/01/2003  T:  10/02/2003  Job:  [id number]

## 2011-01-26 NOTE — Discharge Summary (Signed)
[name]  Patient:    Patrick Tate, Patrick Tate                      MRN: [id number] Adm. Date:  [id number] Disc. Date: [id number] Attending:  Loanne Drilling Dictator:   Della Goo, P.A.                           Discharge Summary  ADMISSION DIAGNOSES: 1. Infected left total knee arthroplasty. 2. Gastroesophageal reflux disease. 3. History of renal calculi treated with lithotripsy. 4. Rheumatoid arthritis, steroid dependent. 5. History of rheumatic fever.  DISCHARGE DIAGNOSES: 1. Infected left total knee arthroplasty. 2. Gastroesophageal reflux disease. 3. History of renal calculi treated with lithotripsy. 4. Rheumatoid arthritis, steroid dependent. 5. History of rheumatic fever. 6. Posthemorrhagic anemia.  PROCEDURE:  On December 13, 2000, the patient underwent resection arthroplasty of infected left total knee replacement performed by Dr. Lequita Halt assisted by Maud Deed, P.A.-C under general anesthesia.  CONSULTATIONS:  Dr. Ninetta Lights and Dr. Roxan Hockey of infectious disease.  BRIEF ADMISSION HISTORY:  Patrick Tate is a 70 year old white male, status post left total knee replacement revision late in the year of 2001.  Following this procedure, he had multiple episodes of hematoma and underwent irrigation and debridement twice with closure over drains.  He was noted to have coag-negative Staph infection in January 2002 with repeat cultures in March 2002 showing coag-negative Staph.  He was treated with prolonged oral antibiotics.  He has continued to have pain of the knee with edema.  He continues to have reaccumulation of hematoma in the knee.  It was felt he would require surgical intervention for removal of the hardware and placement of an antibiotic-impregnated spacer.  He was admitted for this procedure.  HOSPITAL COURSE:  The patient tolerated the procedure without difficulty.  A Hemovac drain was sutured into the wound at the time of surgery and  continued draining throughout the hospital stay.  The last shift in the hospital noted 65 cc of bloody drainage from the Hemovac.  The patients hemoglobin dropped to 8.2 postoperatively; however, he did not require a blood transfusion as he had no signs or symptoms from his posthemorrhagic anemia.  Cultures from the wound continued to show no growth throughout the hospital stay.  Blood cultures taken also showed no growth.  Infectious disease consult was obtained, and appropriate antibiotic therapy was felt to be Ancef 1 g q.8h. for a total of six weeks as well as rifampin 300 mg two tablets once q.d.  The rifampin would be utilized for six weeks as well.  The patient initially was maintained with pain control on PCA pump and eventually weaned to p.o. analgesics.  He utilized OxyContin 20 mg b.i.d. with Percocet for breakthrough pain.  He did have some stomach upset which he felt was related to Percocet. The patient was fitted with a locked knee brace.  He was allowed weightbearing as tolerated on the operative extremity.  Unfortunately, the locked knee brace was uncomfortable, but he was able to utilize the knee immobilizer without difficulties.  He was able to ambulate to the bathroom independently utilizing a walker.  On December 17, 2000, the patient was felt to be stable for discharge to his home for continuation of his care via [facility].  PERTINENT LABORATORY AND X-RAY DATA:  As stated above, cultures were negative. C-reactive protein on April 7 was noted to be 5.8, and  repeat on April 8 was noted to be 3.4.  The patient was on Coumadin during the hospital stay, and INR and pro times showed appropriate elevations.  Hemoglobin stabilized at 8.6 prior to discharge.  Chemistry studies on April 5 with sodium 146, BUN 24, remaining values normal.  Repeat chemistries daily showed values within normal limits with exception of mildly elevated glucose while receiving IV  fluids. Urinalysis on admission was negative for urinary tract infection.  A PICC line was placed during the hospital stay, and x-rays showed proper position.  Chest x-ray preoperatively showed no active disease.  DISPOSITION:  The patient is discharged to his home.  DISCHARGE MEDICATIONS: 1. Arrangements have been made for him to receive antibiotic therapy,    Ancef 1 g IV q8h. 2. Coumadin management will also be provided by [facility]. 3. OxyContin 20 mg one p.o. b.i.d., #40. 4. Rifampin 600 mg p.o. q.d., #84 5. Restoril 30 mg one p.o. q.h.s. p.r.n. sleep, #12.  SPECIAL DISCHARGE INSTRUCTIONS:  Even though he has a low hemoglobin, we will avoid iron at this time as he is having diarrhea.  He has been advised utilize iron-rich foods in his diet.  DISCHARGE INSTRUCTIONS:  He will wear his knee immobilizer at all times and will be weightbearing as tolerated on the operative extremity.  Drain will be left in place and should remain intact at the time of discharge with dressing remaining intact as well.  He will resume a regular diet at home.  DISCHARGE FOLLOWUP:  He will follow up on December 19, 2000, in Dr. Salina April office for removal of the Hemovac drain as well as change of dressing.  If he has questions or concerns prior to his return office visit, he has been advised to call the office.  All of the instructions were discussed with the patient and his wife at the time of discharge.  All questions were answered. DD:  12/17/00 TD:  12/17/00 Job: [id number]

## 2011-01-26 NOTE — Discharge Summary (Signed)
[facility]  Patient:    Patrick Tate, Patrick Tate Visit Number: [id number] MRN: [id number]          Service Type: SUR Location: 4W [id number] 02 Attending Physician:  Loanne Drilling Dictated by:   Druscilla Brownie Shela Nevin, P.A. Admit Date:  10/20/2001 Discharge Date: 10/23/2001   CC:         Helene Kelp, M.D.   Discharge Summary  ADMITTING DIAGNOSES: 1. Loose tibial component, left total knee replacement arthroplasty. 2. Hiatal hernia. 3. Rheumatoid arthritis. 4. History of anxiety and depression.  DISCHARGE DIAGNOSES: 1. Loose tibial component, left total knee replacement arthroplasty. 2. Hiatal hernia. 3. Rheumatoid arthritis. 4. History of anxiety and depression. 5. Mild postoperative anemia.  OPERATION:  On October 20, 2001, the patient underwent left tibial revision for loose left tibial component of total knee.  Avel Peace, P.A.-C. assisted.  CONSULTS:  None.  BRIEF HISTORY:  This is a 70 year old male who has had multiple knee constructions due to previous trauma.  Last year, the patient had a redo of a total knee arthroplasty.  Unfortunately, he has developed significant pretibial pain in the left knee.  X-rays show a probable loosening of the tibial components.  This is a very active gentleman.  He is quite frustrated at the pain in his knee and the inability to do the things he likes to do during the day, and it was felt that he would benefit with the above procedure and he was admitted for revision of the tibial component.  COURSE IN HOSPITAL:  The patient tolerated the surgical procedure quite well, entered eagerly into the total knee protocol which was initiated.  We allowed 50% partial weightbearing as tolerated during his activities.  He worked diligently with physical therapy.  He was placed on Coumadin protocol for prevention of DVT.  On the day of discharge, the patients wound was dry. Neurovascularly, he was intact in the left  lower extremity.  It was felt that he could be maintained in his home environment.  He will receive home health care through [facility] and home health nurse is to draw the pro times per pharmacy protocol.  He is to continue with his home medications and diet.  LABORATORY VALUES:  Hematologically, showed a CBC with differential which essentially was within normal limits on admission, with a very mild low hemoglobin at 12.8.  Final hemoglobin was 10.2 with hematocrit at 30.1.  This was up from his previous hemoglobin.  Blood chemistries were within normal limits.  Urinalysis was negative for urinary tract infection.  No chest x-ray or electrocardiogram were seen on this chart.  CONDITION ON DISCHARGE:  Improved, stable.  PLAN:  The patient is discharged to his home in care of his family to continue with Coumadin protocol three weeks after date of surgery.  He is to continue his home medications and diet.  DISCHARGE MEDICATIONS: 1. Robaxin 500 mg, #30, with a refill, 1 q.6h. p.r.n. muscle spasm. 2. OxyContin 20 mg, #60, 1 b.i.d., no refill. 3. OxyIR, #50, 1-2 q.4-6h. p.r.n. breakthrough pain. 4. Coumadin is to be prescribed per pharmacy Coumadin protocol.  DISCHARGE INSTRUCTIONS:  Use dry dressing p.r.n.  Return to see Dr. Lequita Halt in several days. Dictated by:   Druscilla Brownie. Shela Nevin, P.A. Attending Physician:  Loanne Drilling DD:  10/23/01 TD:  10/23/01 Job: [id number]

## 2011-01-26 NOTE — Op Note (Signed)
[facility]  Patient:    Patrick Tate, MCCAUGHAN                      MRN: [id number] Proc. Date: 07/31/00 Adm. Date:  [id number] Attending:  Ollen Gross V                           Operative Report  PREOPERATIVE DIAGNOSIS:  Loose left tibial component of total knee arthroplasty.  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  PROCEDURE PERFORMED:  Left tibial revision with excision of patella component.  SURGEON:  Ollen Gross, M.D.  ASSISTANT:  Druscilla Brownie. Shela Nevin, P.A.  ANESTHESIA:  General.  ESTIMATED BLOOD LOSS:  200 cc.  DRAINS:  Hemovac x 1.  COMPLICATIONS:  None.  TOURNIQUET TIME:  63 minutes at 350 mmHg, being down for 10 and up again for 25.  CONDITION:  Stable to recovery room.  INDICATIONS:  Taeveon is a 70 year old male who has had a total knee arthroplasty and then subsequent revision at [facility] with revision being about two years ago.  He had progressively increasing tibial pain over the past several months.  He did not have any injury.  He has not had any effusion or infectious type symptoms.  Bone scan showed a loose tibial component.  X-rays were also suggestive of this.  He presents now for tibial revision.  DESCRIPTION OF PROCEDURE:  After the successful administration of general anesthetic, the tourniquet was placed on the left thigh, and the left lower extremity prepped and draped in the usual sterile fashion.  The extremity was wrapped in an Esmarch, the knee flexed, and the tourniquet inflated to 350 mmHg.  His old medial parapatellar incisions were utilized and the skin cut with a #10 blade through the subcutaneous tissue.  A fresh blade was used to make a medial parapatellar arthrotomy.  The soft tissue over the proximal medial tibia is elevated to the joint line with the knife and then around the medial side of the tibia with a curved osteotome.  The  lateral tissue was also elevated with attention being paid to avoiding the patella tendon on the tibial tubercle.  He had a lot of metal stained tissue and significant synovitis, and all of this was debrided.  A stat Grams stain is sent, which is negative.  Frozen section did show areas of acute inflammation, but the overall picture was not consistent with infection.  A synovectomy is subsequently performed and the areas of metal stained tissue debrided.  The patellar component showed significant wear, and once I debrided the soft tissue from around the component, the component just basically fell out.  It had sheared off from the pegs.  The patella was debrided, and just a thin shell just, thus another patellar component cannot be inserted.  The patella is everted and the knee flexed, and then the tibia subluxed forward to remove the tibial polyethylene.  There is no evidence of damage to the polyethylene. There is a tremendous amount of synovial debris posterior to the tibia, and this is carefully debrided back to normal looking tissue.  The tibial component interface was then disrupted with an osteotome and the whole component was easily removed consistent with a loose component.  The cement was also removed and he had a large defect medially involving the cortex as well as a cavitary defect.  Laterally, he had a  cavitary defect also.  A femoral head allograft is then shaped so as to fit the defect after the defect was debrided back to more normal looking bone with a high speed bur.  A perfect fit is obtained medially, and this is then pinned with a K-wire for temporary fixation.  Laterally, I was able to press fit the allograft down into the bone.  The central tibial canal was then reamed up to 16 mm.  The prior stem that was removed was only 14 mm.  The 16 mm dummy stem was then placed, and the cutting block placed so as to shave the tibial surface.  This lead to a nice flat resection.   The trial tibia, size 4 with a 60 mm stem, and then a 10 mm medial and lateral platform is placed.  There was an excellent fit and excellent coverage.  A 20 mm insert was necessary to have good balance in both flexion and extension.  Full extension is achieved with excellent varus and valgus balance down pass 100 degrees with flexion.  At this point, the tourniquet is released for a total time of 63 minutes, and is held down for 10 minutes while the patient is further debrided and the components are assembled on the back table.  The tourniquet was letdown to make sure there was no enlarged bleeders posteriorly, and everything looked fine.  On the back table, the size 4 [employer] tibial tray with a 16 x 115 stem and 5 mm medial and lateral platform is assembled.  Once the tourniquet was down for 10 minutes and the extremities rewrapped with an Esmarch, and the tourniquet reinflated to 350 mmHg.  Cut bone surfaces are prepared with pulsatile lavage. Cement is mixed and the wound is ready for implantation.  The proximal portion is cemented into place and distally we had great press fit with the stem.  The trial 20 mm spacer is placed and the knee held in full extension.  All extruded cement was removed.  Once the cement was fully hardened, the new permanent 20 mm TC3 insert is impacted into the tibial tray and the knee reduced.  Once again, there was excellent balance.  The wound was then copiously irrigated with antibiotic solution, and the extensor mechanism closed over one limb of the Hemovac drain with interrupted #1 PDS.  The tourniquet is then released.  Minor bleeding stopped with cautery.  The subcu was closed with interrupted 2-0 Vicryl and the skin with staples.  The drain was hook to suction.  A bulky sterile dressing applied.  The patient was placed into a knee immobilizer, awakened, and transported to the recovery room in stable condition. DD:  07/31/00 TD:   08/02/00 Job: [id number]

## 2011-01-26 NOTE — Discharge Summary (Signed)
NAME:  Patrick Tate, Patrick Tate                         ACCOUNT NO.:  [id number]   MEDICAL RECORD NO.:  [id number]                   PATIENT TYPE:  INP   LOCATION:  0455                                 FACILITY:  [facility]   PHYSICIAN:  Ollen Gross, M.D.                 DATE OF BIRTH:  Aug 15, 1941   DATE OF ADMISSION:  10/01/2003  DATE OF DISCHARGE:  10/05/2003                                 DISCHARGE SUMMARY   ADMISSION DIAGNOSES:  1. Loose and stable left total knee.  2. Anxiety.  3. Depression.  4. Hypertension.  5. Reflux disease.  6. History of ulcers.  7. Hiatal hernia.  8. Renal calculi.  9. Status post left total knee replacement arthroplasty with four subsequent     revisions.   DISCHARGE DIAGNOSES:  1. Failed left total knee arthroplasty, status post left total knee     replacement arthroplasty revision.  2. Postoperative blood loss anemia.  3. Status post transfusion without sequela.  4. Mild postoperative hyponatremia, improved.  5. Postoperative elevated temperature, improved.  6. Anxiety.  7. Depression.  8. Hypertension.  9. Reflux disease.  10.      History of ulcers.  11.      Hiatal hernia.  12.      Renal calculi.  13.      Status post left total knee replacement arthroplasty with four     subsequent revisions.   PROCEDURE:  Date of surgery, October 01, 2003, left total knee arthroplasty  revision. Surgeon was Dr. Ollen Gross; assistant was Alexzandrew L.  Perkins, PA-C. Anesthesia was general. Blood loss was 500 cc.  Hemovac drain  times one.  Tourniquet time was 81 minutes, 300 mmHg; down for 20 minutes  and then up again for 19 minutes at 300 mmHg.   CONSULTATIONS:  Medical, Dr. Nolon Rod Monguilod.   BRIEF HISTORY:  Dougles is a 70 year old male who has had multiple left knee  surgeries, most recently knee revision arthroplasty approximately two years  ago. For the past three or four months he has had progressively worsening  pain and evidence of  loosening. He has had options of revising this to hinge  implant versus above-knee amputation. He presents now for attempted revision  to a hinge implant.   LABORATORY DATA:  CBC on admission revealed a hemoglobin of 10.5, hematocrit  33.2, white cell count 10.3, red cell count 4.23. Differential showed  elevated neutrophils of 83, lymphs 11, monos 5, eos 0, basos 4.  Postoperative H&H 9.6 and 29.7. Hemoglobin continued to decline down to 8  and 24.2. He was given two units of blood and post transfusion hemoglobin  10.3 __________. PT and PTT preoperatively 12.9 and 35 respectively with INR  of 1.0.  Serial protimes followed. Last known PT-INR 17.5 and 1.7. Chem  panel on admission revealed elevated BUN of 32, mildly elevated glucose of  123, mild elevated ALT of  122.  Serial albumins were followed. BUN came down  to 22 back up to 31. Sodium dropped to 144, down to 133, and back up to 139.  Creatinine went up to 1.7, 1.8, 1.9, and back down to 1.8.  Calcium dropped  down to __________.  Hemoglobin A1C 6.5%.  TSH level was low at 0.205.  Urinalysis preoperatively was negative. Follow-up UA showed large blood,  trace ketones, positive protein, moderate leukocyte esterase, too numerous  red cells to count, a few bacteria, 3 to 6 white cells. Blood group type A  positive. Blood cultures times two no growth. Urine culture times one no  growth.   EKG dated September 24, 2003: Normal sinus rhythm, axis has once again  shifted, question lead placement, possible inferior infarct, interpreted by  Dr. Nona Dell. Follow-up EKG on October 02, 2003: Sinus tachycardia,  nonspecific T-wave abnormalities.  Two-views left knee: Status post total  knee revision. No adverse features identified. Portable chest, October 02, 2003: No active lung disease, mild cardiomegaly.  Chest, two view, on  September 24, 2003: No active disease.   HOSPITAL COURSE:  The patient was admitted to [facility],  taken to  the OR, and underwent to the above-stated procedure without complications.  The patient tolerated the procedure well, was taken to the recovery room,  and then to the orthopedic floor for continued postoperative care. Vital  signs were followed. The patient did run a postoperative temperature fairly  high at 103. Not sure whether he has pulmonary issues. An electrocardiogram  was ordered and incentive spirometer. Chest x-ray was checked. Medical  consultation was called. The patient was seen in consultation by [facility]. Blood cultures and urine culture was taken. He was switched  over from Kefzol to Avelox antibiotics. Increased his prednisone. By day  two, the patient was doing a little better. Hemoglobin had declined a little  bit down to 8.9. His Foley was discontinued. His fever had improved and by  two days after surgery the patient's temperature was back down and he was  afebrile.  I initiated some physical therapy, starting to get up. He still  ran a slight low grade temperature of 100.1 today and on postoperative day  three there were no signs of infection. The patient actually did very well  with physical therapy and initially ambulated up to 100 feet and then  eventually up to 200 feet.  He did have some mild renal insufficiency which  was probably chronic secondary to his rheumatoid arthritis. Due to the drop  in his hemoglobin it was recommended that he receive blood. His hemoglobin  continued down to 8.9 and 8.0. He did receive packed cells and his  hemoglobin responded and was back up to 10.1.  The patient continued to  improve and by postoperative day four he received his blood and his  hemoglobin was back up. He had been ambulating quite well with physical  therapy. He had no complaints, was feeling better after his transfusion, and  was ready to go  home.   DISCHARGE PLAN:  1. The patient is discharged home on October 05, 2003. 2. Discharge  diagnoses--please see above.  3. Discharge medications--Trinsicon, Coumadin, Percocet, Robaxin. He is to     continue his home medications with the exception of Voltaren.  4. Diet--low sodium diet.  5. Followup--next Tuesday, to call the office for an  appointment, [phone],     to set up follow-up visit.  6. Activity--he  is partial weightbearing, 50% left lower extremity. He is to     have 15% passive range of motion of the knee, but he is not to exceed 90     degrees of flexion.   DISPOSITION:  To home.   CONDITION ON DISCHARGE:  Improved.     Alexzandrew L. Julien Girt, P.A.              Ollen Gross, M.D.    ALP/MEDQ  D:  11/03/2003  T:  11/03/2003  Job:  [id number]   cc:   Rosanne Sack, M.D.  [address]  [place], [place] [address]  Fax: [phone]   Ollen Gross, M.D.  [facility]  [address]  [address]  [place]  [place] [address]  Fax: [phone]

## 2011-01-26 NOTE — Discharge Summary (Signed)
[facility]  Patient:    Patrick Tate, Patrick Tate                      MRN: [id number] Adm. Date:  [id number] Disc. Date: [id number] Attending:  Loanne Drilling Dictator:   Ralene Bathe, P.A.C.                           Discharge Summary  ADMISSION DIAGNOSES: 1. Loosening of left total knee revision arthroplasty. 2. Osteoarthritis/rheumatoid arthritis. 3. Anxiety/depression disorder.  DISCHARGE DIAGNOSES: 1. Loosening of left total knee revision arthroplasty. 2. Osteoarthritis/rheumatoid arthritis. 3. Anxiety/depression disorder. 4. Postoperative hematuria secondary to catheterization which cleared. 5. Postoperative mild anemia, not requiring transfusion. 6. Status post revision of tibial component and excision of patella left total    knee arthroplasty.  CONSULTATIONS:  None.  HISTORY OF PRESENT ILLNESS:  This is a 70 year old male who has long-standing osteo and rheumatoid arthritis who had total knee replacement arthroplasty on the left with subsequent revision at [facility], with the most recent revision being about 3 years ago.  He has had progressive pain over the past several months.  Admits to no injury.  He has not had any effusion or infection type cyst symptoms, and bone scan showed loosening of his tibial component.  He is therefore being admitted for left knee evaluation and revision as indicated.  HOSPITAL COURSE:  The patient was admitted, underwent the above named procedure and tolerated this well.  All appropriate IV antibiotics and analgesics were supplied.  Intraoperatively, the left tibial component was found to be loosened, as well as the patella component.  He underwent excision of the patella component and the revision of the left tibial component. Postoperatively, the patient was placed on DVT and PE prophylaxis with Coumadin.  He was also placed weightbearing as tolerated total knee replacement protocol.   Postoperatively, the patient was noted to have significant hematuria.  This did begin to clear.  The patient was having some flank pain, and had a history of kidney stones.  It was felt that this was either from traumatic catheterization or kidney stones.  He was asymptomatic, however, the Foley remained one additional day.  It was removed without difficulty, and the patient was voiding without difficulty prior to his discharge home.  Overall, the patient had an extremely uncomplicated hospital course.  He progressed with total knee replacement protocol.  He remained hemodynamically stable and worked with therapy.  Arrangements were made for discharge to home.  On August 03, 2000, the patient was afebrile.  He pain was controlled on p.o. analgesics.  He was neurovascularly intact, and incision was dry without any drainage.  At this time the patient was felt stable for discharge to home.  Home health arrangements were made.  LABORATORY DATA:  Admission hemogram 4.2 hemoglobin, postoperatively at 10.5, 10.1, and 10.5, stable.  Coagulation on admission was within normal limits, followed by pharmacy with prophylaxis with Coumadin.  Chemistries showed a mild elevation of BUN on admission, however, it was normalized postoperatively.  Glucose at 116 and 176.  ALP mildly elevated at 133. Urinalysis on admission within normal limits.  Blood type showed A+.  Gram stain intraoperatively was negative.  Chest x-ray showed no active disease from July 25, 2000.  Postoperative knee films showed status post left knee revision, no complicating features.  CONDITION ON DISCHARGE:  Stable and improved.  To home.  Home health  RN is arranged.  ACTIVITY:  The patient is weightbearing as tolerated to the opposite extremity per total knee replacement protocol.  FOLLOWUP:  Two weeks postoperatively, call for a time.  DISCHARGE MEDICATIONS: 1. OxyContin SR 10 mg one b.i.d. #30. 2. OxyContin IR 5 mg one  q.4-6h. p.r.n. pain. 3. Robaxin 500 mg #30 one q.8h. p.r.n. spasm. 4. Coumadin per pharmacy.  Daily prothrombin times are arranged.  WOUND CARE:  The patient is to keep wounds clean and dry.  Daily dressing changes as needed, and may shower at this time.  Resume other home medications and home diet. DD:  08/19/00 TD:  08/19/00 Job: [id number]

## 2011-01-26 NOTE — Op Note (Signed)
[facility]  Patient:    Patrick Tate, Patrick Tate                      MRN: [id number] Proc. Date: 12/13/00 Adm. Date:  [id number] Disc. Date: [id number] Attending:  Cain Sieve                           Operative Report  PREOPERATIVE DIAGNOSIS:  Infected left total knee arthroplasty.  POSTOPERATIVE DIAGNOSIS:  Infected left total knee arthroplasty.  PROCEDURE:  Resection arthroplasty, left knee with placement of antibiotic spacer.  SURGEON:  Dr. Lequita Halt.  ASSISTANT:  Maud Deed, P.A.-C.  ANESTHESIA:  General.  ESTIMATED BLOOD LOSS:  200.  DRAINS:  Hemovac x 1.  TOURNIQUET TIME:  Up 64 minutes at 350 down 5 minutes and up an additional 26 at 350.  COMPLICATIONS:  None.  CONDITION:  Stable to recovery.  BRIEF CLINICAL NOTE:  Yuji is an 70 year old male who had his second total knee revision with just tibial revision and polyethylene revision back in December of 2000. He subsequently has had recurrent hemarthrosis with evidence of ______ staph. This was sensitive to oral antibiotics. He had an arthroscopic I&D and then subsequently open I&D with attempted retention of the components. He has had recurrent effusions despite this and presents now for resection arthroplasty.  DESCRIPTION OF PROCEDURE:  After successful administration of general anesthetic, a tourniquet was placed high on the left thigh and left lower extremity prepped and draped in the usual sterile fashion. Extremities wrapped in Esmarch and tourniquet inflated to 350. Old incisions were utilized, skin cut with a 10 blade through subcutaneous tissue to the extensor mechanism. A fresh blade was used to make an arthrotomy and medial parapatellar. ______ fluid is sent for cultures. He had a tremendous amount of reactive inflamed tissue present in the suprapatellar recess which was debrided back to more healthy looking tissue. The soft tissue over the proximal medial tibia  was elevated to the joint line and over the lateral tibia was also elevated with attention being paid to avoid the patellar tendon on tibial tubercle. The patella was everted, knee flexed. The tibial polyethylene was easily removed. The femoral component borders were then identified and all the abnormal looking soft tissue removed around the edges. An osteotome was used to instruct the interface between the cement and prosthesis and then the prosthesis easily removed without any additional bone loss. He had a tremendous amount of reactive ______ present in the posterior condylar area which is debrided back to a healthy looking bony base. The tibial component was removed by disrupting the interface and then using a bone tap to remove the stem. The allograft had actually healed in nicely to the native bone and was found to be stable. The abnormal appearing tissue was all debrided at this time. The tissue was cultured. The soft tissue debridement was extensive especially posterior. Because of this, the tourniquet was released with a total time of 64 minutes and there is no major bleeding identified. Some of the bleeding scar tissue was cauterized. The wound was then copiously irrigated with pulsatile lavage and after the tourniquet was down for five minutes, her leg was rewrapped and the tourniquet was reinflated to 350. The soft tissue debridement was completed anteriorly and then three batches of Palacos cement with 2 gm of vancomycin and 1.5 gm of Zinacef were mixed together. The block is formulated to match the  medial and lateral dimension of the tibia, femur and anterior posterior dimensions. I purposely left it about 6-7 mm short of its full flexion and extension gap so as to allow the soft tissues to contract medially and laterally for when we do the reconstruction so we want have to use ______ inserts. Once the cement is fully hardened and further irrigation is performed, a Hemovac  drained is placed. The extensor mechanism is closed over the Hemovac drain. The subcu is then closed with interrupted 2-0 Vicryl and the skin with staples. The tourniquet is then released and the drain hooked to suction. A bulky sterile dressing and knee immobilizer placed. The patient is awakened and transported to recovery in stable condition. DD:  12/13/00 TD:  12/13/00 Job: [id number]

## 2011-01-26 NOTE — H&P (Signed)
Patrick Tate, Patrick Tate                         ACCOUNT NO.:  [id number]   MEDICAL RECORD NO.:  [id number]                   PATIENT TYPE:  INP   LOCATION:  0446                                 FACILITY:  [facility]   PHYSICIAN:  Ollen Gross, M.D.                 DATE OF BIRTH:  [birth year]   DATE OF ADMISSION:  10/11/2003  DATE OF DISCHARGE:                                HISTORY & PHYSICAL   CHIEF COMPLAINT:  Left knee pain and swelling.   HISTORY OF PRESENT ILLNESS:  This is a 70 year old male well known to Dr.  Ollen Gross who had previously undergone a revision arthroplasty of the  left total knee. The patient has a significant history having undergone a  previous total knee in the past and with four subsequent revisions on it. He  has undergone at least 23 surgeries on his knee since 1991. He does have a  significant history of rheumatoid arthritis. He underwent his revision knee  back on the 21st earlier this month. He had a fairly uneventful hospital  course, went home. He was doing well up until the weekend when he  unfortunately had some new onset of burning and swelling into the left knee.  He noticed  his knee swelled up extremely large. He contacted the office on  Monday morning. Due to the recent surgery and the significant change, he was  brought in and evaluated in the office. He was evaluated by Dr. Hayden Rasmussen  and Jodene Nam, PA-C. He was found to have a swollen knee and likely had  a large bleed and hemarthrosis into the knee. It was found out that his INR  had become supertherapeutic at a level of 4.2. He was admitted for  evaluation.   ALLERGIES:  No known drug allergies.   CURRENT MEDICATIONS:  1. Prozac.  2. Prilosec.  3. Micardis.  4. OxyContin.  5. Prednisone.  6. Pravachol.  7. Xanax.   He is currently on Coumadin and Percocet for pain.   PAST MEDICAL HISTORY:  1. Anxiety/depression.  2. Hiatal hernia.  3. Reflux disease.  4. Hypertension.  5. Rheumatoid arthritis.  6. History of renal calculi.   PAST SURGICAL HISTORY:  1. Left total knee replacement arthroplasty with five subsequent revisions,     most recently being on October 01, 2003.  He has undergone at least 24     surgeries on his knee since 1991.  2. Bilateral rotator cuff repair.  3. Right biceps tendon repair.   SOCIAL HISTORY:  Married, one son, nonsmoker. No alcohol.   FAMILY HISTORY:  Father deceased at age 28with history of COPD and smoking.   REVIEW OF SYSTEMS:  GENERAL: He denies any fevers, chills, or nightsweats.  NEUROLOGIC: No seizure, syncope, or paralysis. RESPIRATORY:  No shortness of  breath, productive cough, or hemoptysis. CARDIOVASCULAR: No chest pain,  angina, or orthopnea. GI: No nausea, vomiting,  diarrhea, or constipation.  GU: No dysuria, hematuria, or discharge. MUSCULOSKELETAL: Pertinent to that  of the knee found in the history of present illness.   PHYSICAL EXAMINATION:  VITAL SIGNS: Temperature 98.5, pulse 52, respirations  20, blood pressure 126/59.  GENERAL: A 70 year old white male, well-developed, well-nourished in mild  distress secondary to his left knee pain. He is alert, oriented, and  cooperative.  HEENT:  Normocephalic and atraumatic. Pupils are round and reactive. EOMs  are intact.  NECK: Supple. Trace right carotid bruit versus referred murmur from the  chest.  CHEST: Clear to auscultation to the anterior and posterior chest wall. No  rales, rhonchi, or wheezes.  HEART: Regular rate and rhythm with a grade 2/6 systolic ejection murmur  best heard over the pulmonic point.  ABDOMEN: Soft, nontender. Bowel sounds are present.  RECTAL/BREASTS/GENITALIA: Not done; not pertinent to the present illness.  EXTREMITIES: Significant to the left lower extremity, anterior knee incision  is well healed. Staples are intact. He has massive effusion. He does have  some erythema over the center portion of the anterior incision that  extends  out 2-3 cm in direction, medial and lateral to the incision. He is  significantly tender of the joint line.  No major effusion. There is no  active drainage. There is no red streaking up the calf of the leg.   IMPRESSION:  1. Significant postoperative pain, hemarthrosis.  2. Status post left total knee replacement arthroplasty revision.  3. Anxiety.  4. Depression.  5. Hypertension.  6. Reflux disease.  7. History of ulcers.  8. Hiatal hernia.  9. Renal calculi.  10.      Rheumatoid arthritis.   PLAN:  The patient is admitted to [facility] for treatment of  hemarthrosis, elevation, icing, and consideration of aspiration or formal  I&D washout.     Alexzandrew L. Julien Girt, P.A.              Ollen Gross, M.D.    ALP/MEDQ  D:  10/13/2003  T:  10/13/2003  Job:  [id number]   cc:   Ollen Gross, M.D.  [facility]  [address]  [address]  [place]  [place] [address]  Fax: [phone]   Areatha Keas, M.D.  [address]  [address]  [place]  [place] [address]  Fax: [phone]

## 2011-01-26 NOTE — Discharge Summary (Signed)
[facility]  Patient:    Patrick Tate, Patrick Tate                      MRN: [id number] Adm. Date:  [id number] Disc. Date: [id number] Attending:  Cain Sieve Dictator:   Dorie Rank, [facility]                           Discharge Summary  ADMISSION DIAGNOSES: 1. Hematoma left knee. 2. History of rheumatic fever. 3. Gastroesophageal reflux disease. 4. History of renal calculi treated with lithotripsy. 5. Rheumatoid arthritis, steroid dependent.  DISCHARGE DIAGNOSES: 1. Hematoma left knee. 2. History of rheumatic fever. 3. Gastroesophageal reflux disease. 4. History of renal calculi treated with lithotripsy. 5. Rheumatoid arthritis, steroid dependent.  OPERATIONS AND PROCEDURES:  None  CONSULTATIONS:  None  BRIEF HISTORY:  Mr. Patrick Tate is a 70 year old white male with recent admission to [facility] for IND of his left knee with closure over the drains for hematoma on October 09, 2000, with a discharge on October 11, 2000.  While he was in the hospital, he was on several days of IV antibiotic therapy, he eventually did go to p.o. Levaquin b.i.d.  He was discharged this past Friday and was doing well over the weekend and his wife had been emptying the large Hemovac, it was getting out about two ounces t.i.d. He has had no other bleeding other than from the Hemovac.  On October 14, 2000, he was doing fairly well and the wife went to work after she had emptied about an ounce from the Hemovac.  He called her later that morning and told her that he was bleeding.  She came home and there was a "large amount of blood" over the chair in which he was sitting and dressings, etc.  They called the office and were told to come to the emergency room.  On exam in the emergency room, it was noted that the Hemovac was indeed clotted.  When the patient flexed the knee, he had a moderate amount of bleeding coming out from the proximal anterior surgical wound.  The dressing that  was on his knee, which had been cut away, had a considerable amount of blood on it.  There is no evidence of any puss at all; it was purely bloody drainage. After discussion with Dr. Lequita Halt, the Hemovac was discontinued and only a moderate amount of blood came out from the Hemovac portal site.  It was decided to do some blood work, CBC with differential and BMET, and to go ahead and get a protime on the patient. He was admitted for observation and possible I&D of the left knee.  HOSPITAL COURSE: On October 14, 2000, the patient was admitted to [facility] for pain control and to monitor bleeding from his knee.  On October 15, 2000, the patient was seen and examined, and noted to be fairly comfortable.  There was no bleeding on any of the dressings.  His dressing was changed on October 16, 2000 and noted that he did have no bleeding from his wound and no bleeding with knee flexion.  The patient was put in a knee immobilizer while in the hospital and instructed to try to minimize activity with no motion to the right knee.  While in the hospital, his home medications were resumed.  He used Ambien 10 mg 1 p.o. q.h.s. for sleep while in the hospital.  He  used OxyCodone 10 mg 1 p.o. b.i.d. for pain.  On October 16, 2000, it was felt that since he had no bleeding while in the hospital that he could be discharged home.  LABORATORY DATA:  BMET on October 14, 2000 was normal other than a BUN of 29. CBC revealed hemoglobin at 9.9, hematocrit 31.6.  Platelets were 505,000. Prothrombin time was 13.3 on October 14, 2000.  INR was 1.1.  RADIOLOGY:  None.  CARDIOLOGY:  None.  CONDITION ON DISCHARGE:  Improved.  DISCHARGE PLANS AND MEDICATIONS:  The patient was discharged home.  He was given a prescription for pain medications and antibiotics from his last hospital visit and he was to resume these as well as his home medications.  PHYSICAL ACTIVITY:  He was to keep the  immobilizer on his left knee at all times except when bathing, no knee bending with limited activity.  DIET:  Regular diet.  FOLLOWUP:  He is to followup with Dr. Lequita Halt next Tuesday, October 22, 2000 and he was to call for an appointment. DD:  10/16/00 TD:  10/17/00 Job: [id number]

## 2011-01-26 NOTE — H&P (Signed)
[facility]  Patient:    Patrick Tate, Patrick Tate Visit Number: [id number] MRN: [id number]          Service Type: SUR Location: 4W [id number] [id number] Attending Physician:  Loanne Drilling Dictated by:   Ottie Glazier Wynona Neat, P.A.-C. Admit Date:  01/26/2002                           History and Physical  CHIEF COMPLAINT:  Left knee pain.  HISTORY OF PRESENT ILLNESS:  The patient is a 70 year old white male who is well known to our practice.  The patient has an extensive history concerning his knees.  The patient is status post both a right and a left total knee with multiple revisions and has had three revisions to the left knee.  The patient complains of persistent pain and swelling in the left knee and has undergone multiple aspirations per Dr. Lequita Halt.  The pain, swelling, and disability are to the point where the patient elects to undergo operative intervention in the form of a left knee patellectomy.  ALLERGIES:  No known drug allergies.  MEDICATIONS: 1. ______  75 mg twice daily. 2. Prozac 20 mg once daily. 3. Prilosec 20 mg 1 daily. 4. OxyContin 20 mg 1 p.o. b.i.d. 5. Prednisone 5 mg 2 p.o. q.d. 6. Xanax 0.5 mg 1 p.o. q.d. 7. Keflex 500 mg three times daily.  PAST MEDICAL HISTORY: 1. Hiatal hernia. 2. Rheumatoid arthritis. 3. History of anxiety and depression.  PAST SURGICAL HISTORY: 1. Left total knee arthroplasty with three revisions. 2. Right total knee arthroplasty with revision. 3. Bilateral rotator cuff repairs. 4. Right biceps tendon repair.  SOCIAL HISTORY:  The patient is married, with one son.  He denies any alcohol use.  No current tobacco use.  He stopped in 1991.  PRIMARY CARE PHYSICIAN:  Dr. Phylliss Bob.  FAMILY HISTORY:  Father is living, history of pneumonia.  REVIEW OF SYSTEMS:  GENERAL:  He does say he has occasional night sweats. Denies any fever, chills, weight loss.  HEENT:  Denies any chronic headaches, ringing of the ears, runny  nose, sore throat.  CHEST:  Denies any chronic nonproductive cough, hemoptysis.  CARDIOVASCULAR:  Denies any type of chest pain, syncopal episodes, or irregular heartbeats. GASTROINTESTINAL/GENITOURINARY:  Denies any history of diarrhea, constipation, melena, bright red stools per rectum, dysuria, frequency, or nocturia. EXTREMITIES:  Please see HPI.  NEUROLOGIC:  Denies any history of seizures or strokes.  PHYSICAL EXAMINATION:  VITAL SIGNS:  Blood pressure 140/90, pulse 70, respirations 12.  GENERAL:  Very pleasant 70 year old male appearing his stated age.  HEENT:  Head atraumatic, normocephalic.  NECK:  Supple.  Without masses or carotid bruits.  CHEST:  Clear to auscultation bilaterally.  HEART:  Regular rate and rhythm.  S1, S2.  ABDOMEN:  Soft and nontender.  Bowel sounds are positive.  GENITOURINARY:  Deferred, not pertinent to present illness.  EXTREMITIES:  Examination of the left lower extremity shows a grossly effused left knee with lateral subluxation of the patella with some of the edema extending into the calf area.  Calf ______ are soft.  Mild tenderness with palpation.  Good pedal pulses.  LABORATORY DATA:  Pending.  IMPRESSION: 1. Left patellar pain, status post left total knee arthroplasty with multiple    revisions. 2. Hiatal hernia. 3. Rheumatoid arthritis. 4. History of anxiety and depression.  PLAN:  The patient will be admitted to [facility] to undergo a left  knee patellectomy per Dr. Ollen Gross on Jan 26, 2002.   Currently he is using a retainer at home to keep his knee still enough for sleeping.  The patient requests that he may have a new one ordered as his previous one is approximately 70 years old and is causing some discomfort. Dictated by:   Ottie Glazier. Wynona Neat, P.A.-C. Attending Physician:  Loanne Drilling DD:  01/22/02 TD:  01/23/02 Job: [id number]

## 2011-01-26 NOTE — Op Note (Signed)
NAME:  Patrick Tate, Patrick Tate                         ACCOUNT NO.:  [id number]   MEDICAL RECORD NO.:  [id number]                   PATIENT TYPE:  AMB   LOCATION:  DAY                                  FACILITY:  [facility]   PHYSICIAN:  Ollen Gross, M.D.                 DATE OF BIRTH:  [birth year]   DATE OF PROCEDURE:  05/17/2004  DATE OF DISCHARGE:                                 OPERATIVE REPORT   PREOPERATIVE DIAGNOSIS:  Left leg hematoma with possible infection.   POSTOPERATIVE DIAGNOSIS:  Left leg hematoma with possible infection.   PROCEDURE:  Irrigation and debridement, left leg.   SURGEON:  Ollen Gross, M.D.   ANESTHESIA:  General.   ESTIMATED BLOOD LOSS:  Minimal.   DRAINS:  Hemovac x1.   COMPLICATIONS:  None.   CONDITION:  Stable to the recovery room.   CLINICAL NOTE:  Patrick Tate is a 70 year old male who had a complex left knee  revision surgery done many months ago.  Postoperatively, he had an intra-  articular hematoma, which was drained surgically.  He had done very well and  then recently developed swelling in the lower leg and actually had skin  breakdown.  He never had any definitive sign of infection.  He continues  with pain and then intermittent drainage.  It is felt as though he has a  hematoma, as he is draining clots.  He presents now for irrigation and  debridement.   PROCEDURE IN DETAIL:  After successful administration of general anesthetic,  the left lower extremity is prepped and draped in the usual sterile fashion.  The leg is elevated, and the tourniquet inflated to 300 mmHg.  I excised the  sinus tract and made a skin incision about 3 cm long.  We did not encounter  any gross purulent fluid.  Any fluid that was present is sent for Gram's  stain C&S.  I elevated the tissue off the tibia and did not note any  evidence of osteomyelitis.  There was some necrotic-appearing tissue and a  small hematoma present.  This was all debrided back to more healthy-  appearing tissue.  We subperiosteally elevated and did not encounter any  pockets of fluid.  I put pressure on the anterolateral and posterior  compartment to see if I could elicit any fluid, but we did not encounter  any.  I then thoroughly irrigated with antibiotic solution with 1 liter and  then also with saline.  We then placed a Hemovac drain into deep tissue and  closed the subcu with interrupted 2-0 Vicryl and the skin with interrupted 4-  0 nylon.  A bulky sterile dressing was applied.  This drain had been sewn in  with nylon suture.  The drain was hooked to suction.  The patient was  awakened and transported to recovery in stable condition.  Ollen Gross, M.D.   FA/MEDQ  D:  05/17/2004  T:  05/17/2004  Job:  [id number]

## 2011-01-26 NOTE — H&P (Signed)
[facility]  Patient:    Patrick Tate, Patrick Tate                      MRN: [id number] Adm. Date:  [id number] Disc. Date: [id number] Attending:  Loanne Drilling Dictator:   Sammuel Cooper. Mahar, P.A.                         History and Physical  CHIEF COMPLAINT:  Left knee pain.  HISTORY OF PRESENT ILLNESS:  The patient is a 70 year old male who had a second total knee revision with the tibial revision and polyethylene revision back in December 2000.  He subsequently has had recurrent hemarthrosis with evidence of staph infection; this was sensitive to oral antibiotics.  He had an arthroscopic I&D and subsequent open I&D with attempted retention of the components.  He has had recurrent effusions despite this, and on December 13, 2000, the patient underwent resection arthroplasty of infected left total knee replacement by Dr. Despina Hick, and he now needs an I&D with reimplantation of the left total knee prosthesis.  PAST MEDICAL HISTORY:  Significant for osteoarthritis, rheumatoid arthritis, and anxiety and depression.  PAST SURGICAL HISTORY:  Significant for a total knee replacement on left side, shoulder surgery, and a hemihepatectomy.  MEDICATIONS 1. Prozac 20 mg 1 p.o. q.i.d. 2. Prilosec 20 mg 1 p.o. q.d. 3. Xanax 0.5 mg 1 p.o. q.d. 4. Voltaren 75 mg 1 b.i.d. 5. Prednisone 5 mg 2 q.d. 6. OxyContin 1 p.o. b.i.d.  ALLERGIES:  No known drug allergies.  SOCIAL HISTORY:  The patient denies any alcohol use.  Denies current tobacco use; however, he was a smoker for 20 years and quit in 1991.  He is married and lives with his wife.  REVIEW OF SYSTEMS:  Unchanged since his last admission.  PHYSICAL EXAMINATION:  GENERAL:  The patient is a 70 year old white male who is alert and oriented during examination and cooperative to the examination.  VITAL SIGNS:  Temperature 98.2, pulse 76 and regular, respirations 16 and unlabored, and blood pressure 130/80.  HEENT:   Pupils equal, round, and reactive to light and accommodation. Extraocular movement is intact.  Nares patent bilaterally.  Mucosa moist and pink.  LUNGS:  Clear to auscultation bilaterally.  No rales, rhonchi, stridor, wheezes, or rubs appreciated.  HEART:  There is a regular normal S1, S2, regular rate and rhythm.  No murmurs, rubs or gallops appreciated.  NECK:  Supple, no bruits appreciated.  ABDOMEN:  Soft, positive bowel sounds.  Nontender and nondistended.  BREASTS:  Not pertinent.  GU:  Not pertinent.  EXTREMITIES:  As per HPI.  They are neurovascularly intact bilaterally.  See the office note for further detail.  IMPRESSION: 1. Infected left total knee arthroplasty status post left total knee resection    arthroplasty. 2. History of rheumatic fever. 3. Gastroesophageal reflux disease. 4. History of renal calculi treated with lithotripsy. 5. Rheumatoid arthritis, steroid dependent.  PLAN:  Admit to [facility] on Jan 27, 2001, for a reimplantation of the left total knee arthroplasty with Dr. Trudee Grip. DD:  02/05/01 TD:  02/05/01 Job: [id number]

## 2011-01-26 NOTE — H&P (Signed)
[facility]  Patient:    Patrick Tate, Patrick Tate                        MRN: [id number] Adm. Date:  07/25/00 Attending:  Ollen Gross, M.D. Dictator:   Ralene Bathe, P.A.                         History and Physical  CHIEF COMPLAINT:  Left knee pain.  HISTORY OF PRESENT ILLNESS:  This is a 70 year old male who had previously undergone left total knee arthroplasty with subsequent revision, who is having significant left knee pain.  He has been having pretibial pain.  He had x-rays and lab studies done, none of which have been diagnostic.  Two injections intra-articularly have not provided any relief.  He had a revision prosthesis in and the focus is around the tip of the stem.  X-rays were taken and show the entire revision knee prosthesis with lucency proximally at the bone interface.  At that time, he was sent for bone scan which did demonstrate loosening.  At this time, revision is indicated and risks and benefits are discussed with patient and he is in a significant amount of pain and wishes to proceed.  PAST MEDICAL HISTORY 1. Osteoarthritis and rheumatoid arthritis. 2. Anxiety/depression.  PAST SURGICAL HISTORY:  He had a wedge osteotomy in 1991 with subsequent bone grafting revision.  He had a total knee replacement in 1992 on the left and in 1993, on the right.  He has had shoulder surgery as well as bilateral total knee revisions.  He had a hemi-patellectomy in 2001 by Dr. Ollen Gross.  ALLERGIES:  No known drug allergies.  MEDICATIONS 1. Voltaren 75 mg b.i.d. 2. Prozac 20 mg q.d. 3. Prilosec 20 mg q.d. 4. OxyContin 10 mg b.i.d. 5. Prednisone 5 mg two tablets q.a.m. 6. Xanax 0.5 mg q.p.m. 7. Ultram one q.i.d. p.r.n. pain. 8. Feosol b.i.d.  SOCIAL HISTORY:  Patient is married and lives with his wife in a two-story home with a bedroom downstairs and one step into the usual entrance.  He does not smoke and does not use ______ .  He is Felicity Coyer uncle by marriage.   REVIEW OF SYSTEMS:  Family history is noncontributory.  REVIEW OF SYSTEMS:  Patient denies any recent fevers, chills or night sweats. No bleeding tendencies.  CNS:  No blurred or double vision, seizures, headaches or paralysis.  RESPIRATORY:  No shortness of breath, productive cough or hemoptysis.  CARDIOVASCULAR:  No chest pain, angina or orthopnea. GI:  No nausea, vomiting, constipation, melena or bloody stools. GENITOURINARY:  No dysuria or hematuria.  MUSCULOSKELETAL:  As pertinent to present illness.  PHYSICAL EXAMINATION  GENERAL:  Well-developed, well-nourished 70 year old male who appears a little younger than stated age.  VITAL SIGNS:  Blood pressure 132/82.  NECK:  Supple.  No lymphadenopathy and no carotid bruits appreciated on exam.  CHEST:  Clear to auscultation bilaterally.  No rales nor rhonchi.  HEART:  Regular rate and rhythm.  No murmurs, gallops, rubs, heaves or thrills.  ABDOMEN:  Positive bowel sounds.  Soft and nontender.  EXTREMITIES:  As in history of present illness.  He has painful range of motion to the left knee.  He is neurovascularly intact and no pitting edema is noted.  IMPRESSION 1. Loosening of total knee revision arthroplasty. 2. Osteoarthritis/rheumatoid arthritis. 3. Anxiety/depression disorder.  PLAN:  Patient will be admitted for  revision of tibial component, left total knee. DD:  07/29/00 TD:  07/29/00 Job: [id number]

## 2011-06-01 LAB — BASIC METABOLIC PANEL
BUN: 20
Calcium: 9.2
GFR calc non Af Amer: 54 — ABNORMAL LOW
Glucose, Bld: 131 — ABNORMAL HIGH
Potassium: 4.5

## 2011-06-01 LAB — CBC
HCT: 36.9 — ABNORMAL LOW
Platelets: 359
RDW: 17.5 — ABNORMAL HIGH

## 2011-06-20 LAB — BASIC METABOLIC PANEL
BUN: 13
CO2: 27
Calcium: 8.1 — ABNORMAL LOW
Chloride: 98
Creatinine, Ser: 1.51 — ABNORMAL HIGH
GFR calc Af Amer: 57 — ABNORMAL LOW
GFR calc non Af Amer: 47 — ABNORMAL LOW
GFR calc non Af Amer: 58 — ABNORMAL LOW
Glucose, Bld: 117 — ABNORMAL HIGH
Potassium: 4
Sodium: 135
Sodium: 136

## 2011-06-20 LAB — CULTURE, BLOOD (ROUTINE X 2)
Culture: NO GROWTH
Culture: NO GROWTH

## 2011-06-20 LAB — URINALYSIS, ROUTINE W REFLEX MICROSCOPIC
Glucose, UA: NEGATIVE
Hgb urine dipstick: NEGATIVE
Protein, ur: 30 — AB
pH: 5.5

## 2011-06-20 LAB — DIFFERENTIAL
Basophils Relative: 0
Eosinophils Absolute: 0
Eosinophils Relative: 0
Lymphocytes Relative: 10 — ABNORMAL LOW
Lymphocytes Relative: 16
Lymphocytes Relative: 16
Lymphs Abs: 1.2
Lymphs Abs: 1.4
Lymphs Abs: 1.8
Monocytes Absolute: 0.5
Monocytes Absolute: 0.5
Monocytes Relative: 4
Monocytes Relative: 8
Neutro Abs: 9.7 — ABNORMAL HIGH
Neutrophils Relative %: 74
Neutrophils Relative %: 85 — ABNORMAL HIGH

## 2011-06-20 LAB — CBC
HCT: 35.1 — ABNORMAL LOW
Hemoglobin: 11.7 — ABNORMAL LOW
Hemoglobin: 9.3 — ABNORMAL LOW
MCHC: 33.2
MCHC: 33.4
MCV: 86.2
MCV: 86.8
Platelets: 371
RBC: 3.2 — ABNORMAL LOW
RBC: 3.21 — ABNORMAL LOW
RDW: 15.6 — ABNORMAL HIGH
RDW: 15.9 — ABNORMAL HIGH
WBC: 11.4 — ABNORMAL HIGH
WBC: 11.6 — ABNORMAL HIGH

## 2011-06-20 LAB — COMPREHENSIVE METABOLIC PANEL
AST: 18
CO2: 26
Calcium: 7.9 — ABNORMAL LOW
Creatinine, Ser: 1.55 — ABNORMAL HIGH
GFR calc Af Amer: 55 — ABNORMAL LOW
GFR calc non Af Amer: 45 — ABNORMAL LOW
Glucose, Bld: 148 — ABNORMAL HIGH
Total Protein: 4.7 — ABNORMAL LOW

## 2011-06-20 LAB — CARDIAC PANEL(CRET KIN+CKTOT+MB+TROPI)
CK, MB: 3.2
Relative Index: INVALID
Total CK: 63
Troponin I: 0.02

## 2011-06-20 LAB — PROTIME-INR: Prothrombin Time: 13.7

## 2011-06-22 LAB — COMPREHENSIVE METABOLIC PANEL
Albumin: 3.2 — ABNORMAL LOW
Alkaline Phosphatase: 131 — ABNORMAL HIGH
BUN: 32 — ABNORMAL HIGH
CO2: 29
Chloride: 99
Glucose, Bld: 159 — ABNORMAL HIGH
Potassium: 4.7
Total Bilirubin: 0.8

## 2011-06-22 LAB — URINALYSIS, ROUTINE W REFLEX MICROSCOPIC
Glucose, UA: NEGATIVE
pH: 6.5

## 2011-06-22 LAB — CBC
HCT: 37.2 — ABNORMAL LOW
Hemoglobin: 12.4 — ABNORMAL LOW
Platelets: 452 — ABNORMAL HIGH
RBC: 4.33
WBC: 11.2 — ABNORMAL HIGH

## 2011-06-22 LAB — DIFFERENTIAL
Basophils Absolute: 0
Basophils Relative: 0
Monocytes Absolute: 0.3
Neutro Abs: 9.6 — ABNORMAL HIGH

## 2014-07-02 ENCOUNTER — Encounter: Payer: Self-pay | Admitting: Family Medicine

## 2014-07-02 ENCOUNTER — Ambulatory Visit (INDEPENDENT_AMBULATORY_CARE_PROVIDER_SITE_OTHER): Payer: Medicare Other

## 2014-07-02 ENCOUNTER — Ambulatory Visit (INDEPENDENT_AMBULATORY_CARE_PROVIDER_SITE_OTHER): Payer: Medicare Other | Admitting: Family Medicine

## 2014-07-02 VITALS — BP 117/61 | HR 124 | Temp 101.5°F | Wt 250.0 lb

## 2014-07-02 DIAGNOSIS — R05 Cough: Secondary | ICD-10-CM

## 2014-07-02 DIAGNOSIS — R059 Cough, unspecified: Secondary | ICD-10-CM

## 2014-07-02 DIAGNOSIS — N289 Disorder of kidney and ureter, unspecified: Secondary | ICD-10-CM

## 2014-07-02 DIAGNOSIS — R52 Pain, unspecified: Secondary | ICD-10-CM

## 2014-07-02 DIAGNOSIS — R509 Fever, unspecified: Secondary | ICD-10-CM

## 2014-07-02 LAB — POCT CBC
Granulocyte percent: 76.4 %G (ref 37–80)
HCT, POC: 38 % — AB (ref 43.5–53.7)
HEMOGLOBIN: 11.9 g/dL — AB (ref 14.1–18.1)
LYMPH, POC: 1.7 (ref 0.6–3.4)
MCH: 25.9 pg — AB (ref 27–31.2)
MCHC: 31.4 g/dL — AB (ref 31.8–35.4)
MCV: 82.3 fL (ref 80–97)
MPV: 6.5 fL (ref 0–99.8)
PLATELET COUNT, POC: 317 10*3/uL (ref 142–424)
POC Granulocyte: 7.9 — AB (ref 2–6.9)
POC LYMPH PERCENT: 16.9 %L (ref 10–50)
RBC: 4.6 M/uL — AB (ref 4.69–6.13)
RDW, POC: 16.6 %
WBC: 10.3 10*3/uL — AB (ref 4.6–10.2)

## 2014-07-02 LAB — POCT INFLUENZA A/B
INFLUENZA A, POC: NEGATIVE
Influenza B, POC: NEGATIVE

## 2014-07-02 MED ORDER — AZITHROMYCIN 250 MG PO TABS: ORAL_TABLET | ORAL | Status: DC

## 2014-07-02 NOTE — Patient Instructions (Signed)

## 2014-07-02 NOTE — Progress Notes (Signed)
   Subjective:    Patient ID: Patrick Tate, male    DOB: 1941/02/26, 73 y.o.   MRN: [id number]  HPI 73 year old gentleman who is a new patient here with respiratory symptoms for the past week he has had intermittent fevers. Cough is productive of clear sputum. He complains of headache sore throat and myalgias. There is a history of diabetes, hyperlipidemia, and hypertension, he is a bilateral amputee. There is a history of pneumonia in the recent past.    Review of Systems  Constitutional: Positive for fever.  HENT: Positive for congestion and sore throat.   Respiratory: Positive for cough.   Gastrointestinal: Positive for diarrhea.  Neurological: Positive for weakness.  Psychiatric/Behavioral: Positive for decreased concentration.       Objective:   Physical Exam  Constitutional:  Patient seems slow in his responses to my questions. I am not sure what his baseline mentation is like. He does appear chronically and perhaps acutely ill   HENT:  Head: Normocephalic.  Eyes: Pupils are equal, round, and reactive to light.  Neck: Normal range of motion.  Cardiovascular: Normal rate and regular rhythm.   Pulmonary/Chest: Effort normal.  Breath sounds are distant and shallow. There is no tachypnea  Musculoskeletal:  Bilateral amputee  Neurological:  As previously noted mentation is slow  Skin: Skin is warm and dry.   BP 117/61  Pulse 124  Temp(Src) 101.5 F (38.6 C) (Oral)  Wt 250 lb (113.399 kg)  SpO2 94%       Assessment & Plan:  1. Body aches  - POCT Influenza A/B - CMP14+EGFR  2. Cough  - DG Chest 2 View; Future  3. Fever, unspecified fever cause WBC, flu test and CBC are all okay.  O2 sat is okay.  Will treat with zithromax, fluids, tylenol - POCT CBC - CMP14+EGFR  4. Fever of unknown origin   Wardell Honour MD

## 2014-07-03 LAB — CMP14+EGFR
ALK PHOS: 103 IU/L (ref 39–117)
ALT: 19 IU/L (ref 0–44)
AST: 24 IU/L (ref 0–40)
Albumin/Globulin Ratio: 1.3 (ref 1.1–2.5)
Albumin: 3.1 g/dL — ABNORMAL LOW (ref 3.5–4.8)
BUN / CREAT RATIO: 8 — AB (ref 10–22)
BUN: 17 mg/dL (ref 8–27)
CO2: 24 mmol/L (ref 18–29)
Calcium: 9.5 mg/dL (ref 8.6–10.2)
Chloride: 95 mmol/L — ABNORMAL LOW (ref 97–108)
Creatinine, Ser: 2.18 mg/dL — ABNORMAL HIGH (ref 0.76–1.27)
GFR calc Af Amer: 34 mL/min/{1.73_m2} — ABNORMAL LOW (ref >59)
GFR, EST NON AFRICAN AMERICAN: 29 mL/min/{1.73_m2} — AB (ref >59)
GLOBULIN, TOTAL: 2.3 g/dL (ref 1.5–4.5)
Glucose: 153 mg/dL — ABNORMAL HIGH (ref 65–99)
Potassium: 4.3 mmol/L (ref 3.5–5.2)
Sodium: 138 mmol/L (ref 134–144)
Total Bilirubin: 0.4 mg/dL (ref 0.0–1.2)
Total Protein: 5.4 g/dL — ABNORMAL LOW (ref 6.0–8.5)

## 2014-07-05 ENCOUNTER — Telehealth: Payer: Self-pay | Admitting: *Deleted

## 2014-07-05 NOTE — Addendum Note (Signed)
Addended by: Almeta Monas on: 07/05/2014 04:22 PM   Modules accepted: Orders

## 2014-07-05 NOTE — Telephone Encounter (Signed)
Message copied by Almeta Monas on Mon Jul 05, 2014  4:23 PM ------      Message from: Frederica Kuster      Created: Mon Jul 05, 2014  1:06 PM       Needs to see nephrologist; kidney function has declined ------

## 2014-07-05 NOTE — Telephone Encounter (Signed)
Aware, needs to have a referral.

## 2014-07-06 LAB — POCT GLYCOSYLATED HEMOGLOBIN (HGB A1C): Hemoglobin A1C: 6.2

## 2014-07-06 NOTE — Addendum Note (Signed)
Addended by: Orma Render F on: 07/06/2014 05:45 PM   Modules accepted: Orders

## 2015-02-17 ENCOUNTER — Encounter (HOSPITAL_COMMUNITY): Payer: Self-pay | Admitting: *Deleted

## 2015-02-17 ENCOUNTER — Emergency Department (HOSPITAL_COMMUNITY)
Admission: EM | Admit: 2015-02-17 | Discharge: 2015-02-17 | Disposition: A | Discharge status: Home / Self Care | Payer: Medicare Other | Attending: Emergency Medicine | Admitting: Emergency Medicine

## 2015-02-17 DIAGNOSIS — Y838 Other surgical procedures as the cause of abnormal reaction of the patient, or of later complication, without mention of misadventure at the time of the procedure: Secondary | ICD-10-CM | POA: Diagnosis not present

## 2015-02-17 DIAGNOSIS — E785 Hyperlipidemia, unspecified: Secondary | ICD-10-CM | POA: Insufficient documentation

## 2015-02-17 DIAGNOSIS — Z79899 Other long term (current) drug therapy: Secondary | ICD-10-CM | POA: Insufficient documentation

## 2015-02-17 DIAGNOSIS — Z7982 Long term (current) use of aspirin: Secondary | ICD-10-CM | POA: Diagnosis not present

## 2015-02-17 DIAGNOSIS — Z7952 Long term (current) use of systemic steroids: Secondary | ICD-10-CM | POA: Diagnosis not present

## 2015-02-17 DIAGNOSIS — Z794 Long term (current) use of insulin: Secondary | ICD-10-CM | POA: Diagnosis not present

## 2015-02-17 DIAGNOSIS — L7621 Postprocedural hemorrhage and hematoma of skin and subcutaneous tissue following a dermatologic procedure: Secondary | ICD-10-CM

## 2015-02-17 DIAGNOSIS — E119 Type 2 diabetes mellitus without complications: Secondary | ICD-10-CM | POA: Diagnosis not present

## 2015-02-17 DIAGNOSIS — I10 Essential (primary) hypertension: Secondary | ICD-10-CM | POA: Diagnosis not present

## 2015-02-17 LAB — CBC WITH DIFFERENTIAL/PLATELET
Basophils Absolute: 0 10*3/uL (ref 0.0–0.1)
Basophils Relative: 0 % (ref 0–1)
EOS ABS: 0.1 10*3/uL (ref 0.0–0.7)
Eosinophils Relative: 1 % (ref 0–5)
HCT: 34.3 % — ABNORMAL LOW (ref 39.0–52.0)
Hemoglobin: 10.5 g/dL — ABNORMAL LOW (ref 13.0–17.0)
Lymphocytes Relative: 22 % (ref 12–46)
Lymphs Abs: 2.5 10*3/uL (ref 0.7–4.0)
MCH: 24.2 pg — AB (ref 26.0–34.0)
MCHC: 30.6 g/dL (ref 30.0–36.0)
MCV: 79 fL (ref 78.0–100.0)
MONOS PCT: 6 % (ref 3–12)
Monocytes Absolute: 0.7 10*3/uL (ref 0.1–1.0)
Neutro Abs: 8.3 10*3/uL — ABNORMAL HIGH (ref 1.7–7.7)
Neutrophils Relative %: 71 % (ref 43–77)
PLATELETS: 319 10*3/uL (ref 150–400)
RBC: 4.34 MIL/uL (ref 4.22–5.81)
RDW: 18.2 % — ABNORMAL HIGH (ref 11.5–15.5)
WBC: 11.6 10*3/uL — AB (ref 4.0–10.5)

## 2015-02-17 LAB — BASIC METABOLIC PANEL
Anion gap: 10 (ref 5–15)
BUN: 27 mg/dL — ABNORMAL HIGH (ref 6–20)
CO2: 24 mmol/L (ref 22–32)
Calcium: 8.7 mg/dL — ABNORMAL LOW (ref 8.9–10.3)
Chloride: 105 mmol/L (ref 101–111)
Creatinine, Ser: 1.72 mg/dL — ABNORMAL HIGH (ref 0.61–1.24)
GFR calc non Af Amer: 38 mL/min — ABNORMAL LOW (ref >60)
GFR, EST AFRICAN AMERICAN: 44 mL/min — AB (ref >60)
Glucose, Bld: 153 mg/dL — ABNORMAL HIGH (ref 65–99)
Potassium: 4 mmol/L (ref 3.5–5.1)
Sodium: 139 mmol/L (ref 135–145)

## 2015-02-17 NOTE — ED Notes (Signed)
The pt has lower extremity amps.

## 2015-02-17 NOTE — Discharge Instructions (Signed)
Delayed Wound Closure Sometimes, your health care provider will decide to delay closing a wound for several days. This is done when the wound is badly bruised, dirty, or when it has been several hours since the injury happened. By delaying the closure of your wound, the risk of infection is reduced. Wounds that are closed in 3-7 days after being cleaned up and dressed heal just as well as those that are closed right away. HOME CARE INSTRUCTIONS  Rest and elevate the injured area until the pain and swelling are gone.  Have your wound checked as instructed by your health care provider. SEEK MEDICAL CARE IF:  You develop unusual or increased swelling or redness around the wound.  You have increasing pain or tenderness.  There is increasing fluid (drainage) or a bad smelling drainage coming from the wound. Document Released: 08/27/2005 Document Revised: 09/01/2013 Document Reviewed: 02/24/2013 [facility] Patient Information 2015 [place], [place]. This information is not intended to replace advice given to you by your health care provider. Make sure you discuss any questions you have with your health care provider.  Dressing Change A dressing is a material placed over wounds. It keeps the wound clean, dry, and protected from further injury. This provides an environment that favors wound healing.  BEFORE YOU BEGIN  Get your supplies together. Things you may need include:  Saline solution.  Flexible gauze dressing.  Medicated cream.  Tape.  Gloves.  Abdominal dressing pads.  Gauze squares.  Plastic bags.  Take pain medicine 30 minutes before the dressing change if you need it.  Take a shower before you do the first dressing change of the day. Use plastic wrap or a plastic bag to prevent the dressing from getting wet. REMOVING YOUR OLD DRESSING   Wash your hands with soap and water. Dry your hands with a clean towel.  Put on your gloves.  Remove any tape.  Carefully remove the  old dressing. If the dressing sticks, you may dampen it with warm water to loosen it, or follow your caregiver's specific directions.  Remove any gauze or packing tape that is in your wound.  Take off your gloves.  Put the gloves, tape, gauze, or any packing tape into a plastic bag. CHANGING YOUR DRESSING  Open the supplies.  Take the cap off the saline solution.  Open the gauze package so that the gauze remains on the inside of the package.  Put on your gloves.  Clean your wound as told by your caregiver.  If you have been told to keep your wound dry, follow those instructions.  Your caregiver may tell you to do one or more of the following:  Pick up the gauze. Pour the saline solution over the gauze. Squeeze out the extra saline solution.  Put medicated cream or other medicine on your wound if you have been told to do so.  Put the solution soaked gauze only in your wound, not on the skin around it.  Pack your wound loosely or as told by your caregiver.  Put dry gauze on your wound.  Put abdominal dressing pads over the dry gauze if your wet gauze soaks through.  Tape the abdominal dressing pads in place so they will not fall off. Do not wrap the tape completely around the affected part (arm, leg, abdomen).  Wrap the dressing pads with a flexible gauze dressing to secure it in place.  Take off your gloves. Put them in the plastic bag with the old dressing. Stann Mainland  the bag shut and throw it away.  Keep the dressing clean and dry until your next dressing change.  Wash your hands. SEEK MEDICAL CARE IF:  Your skin around the wound looks red.  Your wound feels more tender or sore.  You see pus in the wound.  Your wound smells bad.  You have a fever.  Your skin around the wound has a rash that itches and burns.  You see black or yellow skin in your wound that was not there before.  You feel nauseous, throw up, and feel very tired. Document Released: 10/04/2004  Document Revised: 11/19/2011 Document Reviewed: 07/09/2011 [facility] Patient Information 2015 [place], [place]. This information is not intended to replace advice given to you by your health care provider. Make sure you discuss any questions you have with your health care provider.

## 2015-02-17 NOTE — ED Notes (Signed)
The pt had a skin cancer removed las Thursday.  Circular area mid-chest approx size of a golf ball and deep.  The wound has bled x 2 today.  Holding pressure has stopped the bleeding.  No bleeding at present.  No pain

## 2015-02-17 NOTE — ED Notes (Signed)
Chest cleaned with soap and water where the blood from the wound had dried on his skin  Wife on the way here

## 2015-02-17 NOTE — ED Provider Notes (Signed)
CSN: [id number]     Arrival date & time 02/17/15  0519 History   First MD Initiated Contact with Patient 02/17/15 [phone]     Chief Complaint  Patient presents with  . Open Wound     (Consider location/radiation/quality/duration/timing/severity/associated sxs/prior Treatment) HPI 74 y.o. male presents with bleeding from excision site which began acutely tonight. This occurred twice, resolved on both occasions after pressure. Onset tonight.  No systemic symptoms. Timing of bleeding was constant, improved by pressure, no exacerbating factors. Location was left parasternal. Past Medical History  Diagnosis Date  . Diabetes mellitus without complication   . Hypertension   . Hyperlipidemia    Past Surgical History  Procedure Laterality Date  . Amputee Bilateral    No family history on file. History  Substance Use Topics  . Smoking status: Never Smoker   . Smokeless tobacco: Not on file  . Alcohol Use: No    Review of Systems  All other systems reviewed and are negative.     Allergies  Review of patient's allergies indicates no known allergies.  Home Medications   Prior to Admission medications   Medication Sig Start Date End Date Taking? Authorizing Provider  ALPRAZolam Prudy Feeler) 0.5 MG tablet Take 0.5 mg by mouth at bedtime.    Historical Provider, MD  amLODipine (NORVASC) 5 MG tablet Take 5 mg by mouth daily.    Historical Provider, MD  aspirin EC 81 MG tablet Take 81 mg by mouth daily.    Historical Provider, MD  atorvastatin (LIPITOR) 20 MG tablet Take 20 mg by mouth daily.    Historical Provider, MD  azithromycin (ZITHROMAX) 250 MG tablet 2 tabs stat then 1  Qd x 4 days 07/02/14   Frederica Kuster, MD  Calcium Citrate-Vitamin D (CITRACAL + D PO) Take by mouth.    Historical Provider, MD  diltiazem (CARDIZEM) 90 MG tablet Take 90 mg by mouth 2 (two) times daily.    Historical Provider, MD  DULoxetine (CYMBALTA) 30 MG capsule Take 30 mg by mouth daily.    Historical Provider,  MD  finasteride (PROSCAR) 5 MG tablet Take 5 mg by mouth daily.    Historical Provider, MD  insulin glargine (LANTUS) 100 UNIT/ML injection Inject 40 Units into the skin at bedtime.    Historical Provider, MD  insulin lispro (HUMALOG) 100 UNIT/ML injection Inject into the skin 3 (three) times daily before meals.    Historical Provider, MD  magnesium oxide (MAG-OX) 400 MG tablet Take 400 mg by mouth daily.    Historical Provider, MD  Multiple Vitamin (MULTIVITAMIN) tablet Take 1 tablet by mouth daily.    Historical Provider, MD  predniSONE (DELTASONE) 20 MG tablet Take 20 mg by mouth daily with breakfast.    Historical Provider, MD   BP 126/65 mmHg  Pulse 90  Resp 10  SpO2 100% Physical Exam  Constitutional: He is oriented to person, place, and time. He appears well-developed and well-nourished.  HENT:  Head: Normocephalic and atraumatic.  Eyes: Conjunctivae and EOM are normal.  Neck: Normal range of motion. Neck supple.  Cardiovascular: Normal rate, regular rhythm and normal heart sounds.   Pulmonary/Chest: Effort normal and breath sounds normal. No respiratory distress.  Abdominal: He exhibits no distension. There is no tenderness. There is no rebound and no guarding.  Musculoskeletal: Normal range of motion.  Neurological: He is alert and oriented to person, place, and time.  Skin: Skin is warm and dry.  4 cm excision site hemostatic  Vitals  reviewed.   ED Course  Procedures (including critical care time) Labs Review Labs Reviewed  CBC WITH DIFFERENTIAL/PLATELET - Abnormal; Notable for the following:    WBC 11.6 (*)    Hemoglobin 10.5 (*)    HCT 34.3 (*)    MCH 24.2 (*)    RDW 18.2 (*)    Neutro Abs 8.3 (*)    All other components within normal limits  BASIC METABOLIC PANEL - Abnormal; Notable for the following:    Glucose, Bld 153 (*)    BUN 27 (*)    Creatinine, Ser 1.72 (*)    Calcium 8.7 (*)    GFR calc non Af Amer 38 (*)    GFR calc Af Amer 44 (*)    All other  components within normal limits    Imaging Review No results found.   EKG Interpretation None      MDM   Final diagnoses:  Postoperative hemorrhage of subcutaneous tissue following dermatologic procedure    75 y.o. male with pertinent PMH of recent skin ca excision 1 week ago presents with bleeding from operative site.  No systemic symptoms.  Bleeding resolved with pressure prior to arrival.  Wound as above, hemostatic.  CBC with anemia, but at pt baseline.  Discussed wound care with family at length with hemostatic therapy and they will fu with surgeon today.  DC home in stable condition  I have reviewed all laboratory and imaging studies if ordered as above  1. Postoperative hemorrhage of subcutaneous tissue following dermatologic procedure         Mirian Mo, MD 02/17/15 [phone]

## 2015-07-26 ENCOUNTER — Telehealth: Payer: Self-pay | Admitting: Family Medicine

## 2016-01-30 ENCOUNTER — Inpatient Hospital Stay (HOSPITAL_COMMUNITY)
Admission: EM | Admit: 2016-01-30 | Discharge: 2016-02-04 | DRG: 871 | Disposition: A | Discharge status: Home Health | Payer: Medicare Other | Attending: Internal Medicine | Admitting: Internal Medicine

## 2016-01-30 ENCOUNTER — Emergency Department (HOSPITAL_COMMUNITY): Payer: Medicare Other

## 2016-01-30 ENCOUNTER — Inpatient Hospital Stay (HOSPITAL_COMMUNITY): Payer: Medicare Other

## 2016-01-30 ENCOUNTER — Encounter (HOSPITAL_COMMUNITY): Payer: Self-pay

## 2016-01-30 DIAGNOSIS — I4892 Unspecified atrial flutter: Secondary | ICD-10-CM | POA: Diagnosis present

## 2016-01-30 DIAGNOSIS — I129 Hypertensive chronic kidney disease with stage 1 through stage 4 chronic kidney disease, or unspecified chronic kidney disease: Secondary | ICD-10-CM | POA: Diagnosis present

## 2016-01-30 DIAGNOSIS — R296 Repeated falls: Secondary | ICD-10-CM | POA: Diagnosis present

## 2016-01-30 DIAGNOSIS — N179 Acute kidney failure, unspecified: Secondary | ICD-10-CM | POA: Diagnosis present

## 2016-01-30 DIAGNOSIS — E8770 Fluid overload, unspecified: Secondary | ICD-10-CM | POA: Diagnosis present

## 2016-01-30 DIAGNOSIS — I350 Nonrheumatic aortic (valve) stenosis: Secondary | ICD-10-CM

## 2016-01-30 DIAGNOSIS — T380X5A Adverse effect of glucocorticoids and synthetic analogues, initial encounter: Secondary | ICD-10-CM | POA: Diagnosis present

## 2016-01-30 DIAGNOSIS — E876 Hypokalemia: Secondary | ICD-10-CM | POA: Diagnosis present

## 2016-01-30 DIAGNOSIS — F419 Anxiety disorder, unspecified: Secondary | ICD-10-CM | POA: Diagnosis present

## 2016-01-30 DIAGNOSIS — R059 Cough, unspecified: Secondary | ICD-10-CM

## 2016-01-30 DIAGNOSIS — E118 Type 2 diabetes mellitus with unspecified complications: Secondary | ICD-10-CM

## 2016-01-30 DIAGNOSIS — Z89511 Acquired absence of right leg below knee: Secondary | ICD-10-CM | POA: Diagnosis not present

## 2016-01-30 DIAGNOSIS — E785 Hyperlipidemia, unspecified: Secondary | ICD-10-CM | POA: Diagnosis present

## 2016-01-30 DIAGNOSIS — D509 Iron deficiency anemia, unspecified: Secondary | ICD-10-CM | POA: Diagnosis present

## 2016-01-30 DIAGNOSIS — Z7952 Long term (current) use of systemic steroids: Secondary | ICD-10-CM | POA: Diagnosis not present

## 2016-01-30 DIAGNOSIS — E861 Hypovolemia: Secondary | ICD-10-CM | POA: Diagnosis present

## 2016-01-30 DIAGNOSIS — R609 Edema, unspecified: Secondary | ICD-10-CM

## 2016-01-30 DIAGNOSIS — J9601 Acute respiratory failure with hypoxia: Secondary | ICD-10-CM | POA: Diagnosis present

## 2016-01-30 DIAGNOSIS — R05 Cough: Secondary | ICD-10-CM

## 2016-01-30 DIAGNOSIS — A419 Sepsis, unspecified organism: Secondary | ICD-10-CM | POA: Diagnosis not present

## 2016-01-30 DIAGNOSIS — R7881 Bacteremia: Secondary | ICD-10-CM

## 2016-01-30 DIAGNOSIS — Z89612 Acquired absence of left leg above knee: Secondary | ICD-10-CM | POA: Diagnosis not present

## 2016-01-30 DIAGNOSIS — A4189 Other specified sepsis: Principal | ICD-10-CM | POA: Diagnosis present

## 2016-01-30 DIAGNOSIS — L98429 Non-pressure chronic ulcer of back with unspecified severity: Secondary | ICD-10-CM

## 2016-01-30 DIAGNOSIS — Z794 Long term (current) use of insulin: Secondary | ICD-10-CM

## 2016-01-30 DIAGNOSIS — I10 Essential (primary) hypertension: Secondary | ICD-10-CM | POA: Diagnosis present

## 2016-01-30 DIAGNOSIS — J189 Pneumonia, unspecified organism: Secondary | ICD-10-CM | POA: Diagnosis present

## 2016-01-30 DIAGNOSIS — M069 Rheumatoid arthritis, unspecified: Secondary | ICD-10-CM | POA: Diagnosis present

## 2016-01-30 DIAGNOSIS — Z66 Do not resuscitate: Secondary | ICD-10-CM | POA: Diagnosis present

## 2016-01-30 DIAGNOSIS — E1122 Type 2 diabetes mellitus with diabetic chronic kidney disease: Secondary | ICD-10-CM

## 2016-01-30 DIAGNOSIS — L03115 Cellulitis of right lower limb: Secondary | ICD-10-CM

## 2016-01-30 DIAGNOSIS — D638 Anemia in other chronic diseases classified elsewhere: Secondary | ICD-10-CM | POA: Diagnosis present

## 2016-01-30 DIAGNOSIS — N400 Enlarged prostate without lower urinary tract symptoms: Secondary | ICD-10-CM | POA: Diagnosis present

## 2016-01-30 DIAGNOSIS — N183 Chronic kidney disease, stage 3 unspecified: Secondary | ICD-10-CM | POA: Diagnosis present

## 2016-01-30 DIAGNOSIS — R0602 Shortness of breath: Secondary | ICD-10-CM

## 2016-01-30 DIAGNOSIS — S78112A Complete traumatic amputation at level between left hip and knee, initial encounter: Secondary | ICD-10-CM

## 2016-01-30 DIAGNOSIS — R509 Fever, unspecified: Secondary | ICD-10-CM

## 2016-01-30 HISTORY — DX: Non-pressure chronic ulcer of back with unspecified severity: L98.429

## 2016-01-30 HISTORY — DX: Nonrheumatic aortic (valve) stenosis: I35.0

## 2016-01-30 LAB — COMPREHENSIVE METABOLIC PANEL
ALBUMIN: 2.3 g/dL — AB (ref 3.5–5.0)
ALK PHOS: 96 U/L (ref 38–126)
ALT: 18 U/L (ref 17–63)
ALT: 16 U/L — ABNORMAL LOW (ref 17–63)
ANION GAP: 6 (ref 5–15)
AST: 17 U/L (ref 15–41)
AST: 16 U/L (ref 15–41)
Albumin: 2.9 g/dL — ABNORMAL LOW (ref 3.5–5.0)
Alkaline Phosphatase: 70 U/L (ref 38–126)
Anion gap: 11 (ref 5–15)
BILIRUBIN TOTAL: 0.6 mg/dL (ref 0.3–1.2)
BUN: 29 mg/dL — AB (ref 6–20)
BUN: 29 mg/dL — ABNORMAL HIGH (ref 6–20)
CO2: 26 mmol/L (ref 22–32)
CO2: 22 mmol/L (ref 22–32)
CREATININE: 2.3 mg/dL — AB (ref 0.61–1.24)
Calcium: 9.7 mg/dL (ref 8.9–10.3)
Calcium: 8.2 mg/dL — ABNORMAL LOW (ref 8.9–10.3)
Chloride: 102 mmol/L (ref 101–111)
Chloride: 109 mmol/L (ref 101–111)
Creatinine, Ser: 2.29 mg/dL — ABNORMAL HIGH (ref 0.61–1.24)
GFR calc Af Amer: 31 mL/min — ABNORMAL LOW (ref >60)
GFR calc Af Amer: 31 mL/min — ABNORMAL LOW (ref >60)
GFR calc non Af Amer: 27 mL/min — ABNORMAL LOW (ref >60)
GFR, EST NON AFRICAN AMERICAN: 27 mL/min — AB (ref >60)
GLUCOSE: 211 mg/dL — AB (ref 65–99)
GLUCOSE: 162 mg/dL — AB (ref 65–99)
POTASSIUM: 4.2 mmol/L (ref 3.5–5.1)
Potassium: 3.6 mmol/L (ref 3.5–5.1)
SODIUM: 137 mmol/L (ref 135–145)
Sodium: 139 mmol/L (ref 135–145)
TOTAL PROTEIN: 6.4 g/dL — AB (ref 6.5–8.1)
Total Bilirubin: 0.6 mg/dL (ref 0.3–1.2)
Total Protein: 5 g/dL — ABNORMAL LOW (ref 6.5–8.1)

## 2016-01-30 LAB — CBC WITH DIFFERENTIAL/PLATELET
BASOS PCT: 0 %
Basophils Absolute: 0 10*3/uL (ref 0.0–0.1)
EOS ABS: 0 10*3/uL (ref 0.0–0.7)
Eosinophils Relative: 0 %
HEMATOCRIT: 36.9 % — AB (ref 39.0–52.0)
Hemoglobin: 10.9 g/dL — ABNORMAL LOW (ref 13.0–17.0)
LYMPHS ABS: 1.4 10*3/uL (ref 0.7–4.0)
Lymphocytes Relative: 10 %
MCH: 24.5 pg — AB (ref 26.0–34.0)
MCHC: 29.5 g/dL — AB (ref 30.0–36.0)
MCV: 83.1 fL (ref 78.0–100.0)
MONO ABS: 0.4 10*3/uL (ref 0.1–1.0)
MONOS PCT: 3 %
NEUTROS ABS: 11.7 10*3/uL — AB (ref 1.7–7.7)
Neutrophils Relative %: 87 %
Platelets: 367 10*3/uL (ref 150–400)
RBC: 4.44 MIL/uL (ref 4.22–5.81)
RDW: 17 % — AB (ref 11.5–15.5)
WBC: 13.5 10*3/uL — ABNORMAL HIGH (ref 4.0–10.5)

## 2016-01-30 LAB — CBC
HCT: 33.1 % — ABNORMAL LOW (ref 39.0–52.0)
HEMOGLOBIN: 9.9 g/dL — AB (ref 13.0–17.0)
MCH: 24.7 pg — ABNORMAL LOW (ref 26.0–34.0)
MCHC: 29.9 g/dL — AB (ref 30.0–36.0)
MCV: 82.5 fL (ref 78.0–100.0)
Platelets: 225 10*3/uL (ref 150–400)
RBC: 4.01 MIL/uL — ABNORMAL LOW (ref 4.22–5.81)
RDW: 17 % — AB (ref 11.5–15.5)
WBC: 11.1 10*3/uL — ABNORMAL HIGH (ref 4.0–10.5)

## 2016-01-30 LAB — BLOOD GAS, ARTERIAL
ACID-BASE DEFICIT: 2.3 mmol/L — AB (ref 0.0–2.0)
BICARBONATE: 22.5 meq/L (ref 20.0–24.0)
DRAWN BY: 23534
O2 Content: 3.5 L/min
O2 Saturation: 95.7 %
PH ART: 7.375 (ref 7.350–7.450)
PO2 ART: 81.4 mmHg (ref 80.0–100.0)
pCO2 arterial: 38.7 mmHg (ref 35.0–45.0)

## 2016-01-30 LAB — I-STAT CG4 LACTIC ACID, ED
LACTIC ACID, VENOUS: 2.43 mmol/L — AB (ref 0.5–2.0)
LACTIC ACID, VENOUS: 1.31 mmol/L (ref 0.5–2.0)

## 2016-01-30 LAB — GLUCOSE, CAPILLARY
Glucose-Capillary: 167 mg/dL — ABNORMAL HIGH (ref 65–99)
Glucose-Capillary: 164 mg/dL — ABNORMAL HIGH (ref 65–99)

## 2016-01-30 LAB — PROCALCITONIN: Procalcitonin: 25.56 ng/mL

## 2016-01-30 LAB — MRSA PCR SCREENING: MRSA by PCR: NEGATIVE

## 2016-01-30 MED ORDER — SODIUM CHLORIDE 0.9 % IV BOLUS (SEPSIS)
1000.0000 mL | Freq: Once | INTRAVENOUS | Status: AC
  Administered 2016-01-30: 1000 mL via INTRAVENOUS

## 2016-01-30 MED ORDER — ACETAMINOPHEN 325 MG PO TABS
650.0000 mg | ORAL_TABLET | Freq: Four times a day (QID) | ORAL | Status: DC | PRN
  Administered 2016-01-30: 650 mg via ORAL
  Filled 2016-01-30: qty 2

## 2016-01-30 MED ORDER — INSULIN ASPART 100 UNIT/ML ~~LOC~~ SOLN
0.0000 [IU] | Freq: Three times a day (TID) | SUBCUTANEOUS | Status: DC
  Administered 2016-01-31: 11 [IU] via SUBCUTANEOUS
  Administered 2016-01-31: 3 [IU] via SUBCUTANEOUS
  Administered 2016-01-31 – 2016-02-01 (×2): 8 [IU] via SUBCUTANEOUS
  Administered 2016-02-01: 5 [IU] via SUBCUTANEOUS
  Administered 2016-02-01 – 2016-02-03 (×3): 3 [IU] via SUBCUTANEOUS
  Administered 2016-02-04 (×2): 2 [IU] via SUBCUTANEOUS

## 2016-01-30 MED ORDER — VANCOMYCIN HCL 10 G IV SOLR
2000.0000 mg | Freq: Once | INTRAVENOUS | Status: AC
  Administered 2016-01-30: 2000 mg via INTRAVENOUS
  Filled 2016-01-30: qty 2000

## 2016-01-30 MED ORDER — ASPIRIN EC 81 MG PO TBEC
81.0000 mg | DELAYED_RELEASE_TABLET | Freq: Every day | ORAL | Status: DC
  Administered 2016-01-30 – 2016-02-04 (×6): 81 mg via ORAL
  Filled 2016-01-30 (×6): qty 1

## 2016-01-30 MED ORDER — GUAIFENESIN ER 600 MG PO TB12
1200.0000 mg | ORAL_TABLET | Freq: Two times a day (BID) | ORAL | Status: DC
  Administered 2016-01-30 – 2016-02-04 (×10): 1200 mg via ORAL
  Filled 2016-01-30 (×10): qty 2

## 2016-01-30 MED ORDER — IPRATROPIUM-ALBUTEROL 0.5-2.5 (3) MG/3ML IN SOLN
3.0000 mL | Freq: Four times a day (QID) | RESPIRATORY_TRACT | Status: DC
  Administered 2016-01-30 – 2016-02-01 (×6): 3 mL via RESPIRATORY_TRACT
  Filled 2016-01-30 (×6): qty 3

## 2016-01-30 MED ORDER — INSULIN GLARGINE 100 UNIT/ML ~~LOC~~ SOLN
30.0000 [IU] | Freq: Every day | SUBCUTANEOUS | Status: DC
  Administered 2016-01-30: 30 [IU] via SUBCUTANEOUS
  Filled 2016-01-30 (×2): qty 0.3

## 2016-01-30 MED ORDER — SODIUM CHLORIDE 0.9 % IV BOLUS (SEPSIS)
500.0000 mL | Freq: Once | INTRAVENOUS | Status: AC
  Administered 2016-01-30: 500 mL via INTRAVENOUS

## 2016-01-30 MED ORDER — ALPRAZOLAM 0.5 MG PO TABS
0.5000 mg | ORAL_TABLET | Freq: Every day | ORAL | Status: DC
  Administered 2016-01-30 – 2016-02-03 (×5): 0.5 mg via ORAL
  Filled 2016-01-30 (×5): qty 1

## 2016-01-30 MED ORDER — FINASTERIDE 5 MG PO TABS
5.0000 mg | ORAL_TABLET | Freq: Every day | ORAL | Status: DC
  Administered 2016-01-31 – 2016-02-04 (×4): 5 mg via ORAL
  Filled 2016-01-30 (×10): qty 1

## 2016-01-30 MED ORDER — SODIUM CHLORIDE 0.9 % IV BOLUS (SEPSIS): 1000.0000 mL | Freq: Once | INTRAVENOUS | Status: DC

## 2016-01-30 MED ORDER — ATORVASTATIN CALCIUM 20 MG PO TABS
20.0000 mg | ORAL_TABLET | Freq: Every day | ORAL | Status: DC
  Administered 2016-01-30 – 2016-02-04 (×6): 20 mg via ORAL
  Filled 2016-01-30: qty 2
  Filled 2016-01-30 (×5): qty 1

## 2016-01-30 MED ORDER — FUROSEMIDE 10 MG/ML IJ SOLN
40.0000 mg | Freq: Once | INTRAMUSCULAR | Status: AC
  Administered 2016-01-30: 40 mg via INTRAVENOUS
  Filled 2016-01-30: qty 4

## 2016-01-30 MED ORDER — CETYLPYRIDINIUM CHLORIDE 0.05 % MT LIQD
7.0000 mL | Freq: Two times a day (BID) | OROMUCOSAL | Status: DC
  Administered 2016-01-30 – 2016-02-04 (×10): 7 mL via OROMUCOSAL

## 2016-01-30 MED ORDER — DEXTROSE 5 % IV SOLN
500.0000 mg | Freq: Once | INTRAVENOUS | Status: AC
  Administered 2016-01-30: 500 mg via INTRAVENOUS
  Filled 2016-01-30: qty 500

## 2016-01-30 MED ORDER — INSULIN ASPART 100 UNIT/ML ~~LOC~~ SOLN
0.0000 [IU] | Freq: Every day | SUBCUTANEOUS | Status: DC
  Administered 2016-01-31 – 2016-02-01 (×2): 3 [IU] via SUBCUTANEOUS

## 2016-01-30 MED ORDER — ACETAMINOPHEN 500 MG PO TABS
1000.0000 mg | ORAL_TABLET | Freq: Once | ORAL | Status: AC
  Administered 2016-01-30: 1000 mg via ORAL
  Filled 2016-01-30: qty 2

## 2016-01-30 MED ORDER — ENOXAPARIN SODIUM 40 MG/0.4ML ~~LOC~~ SOLN: 40.0000 mg | SUBCUTANEOUS | Status: DC

## 2016-01-30 MED ORDER — AZITHROMYCIN 500 MG IV SOLR
500.0000 mg | INTRAVENOUS | Status: DC
  Administered 2016-01-31 – 2016-02-02 (×3): 500 mg via INTRAVENOUS
  Filled 2016-01-30 (×4): qty 500

## 2016-01-30 MED ORDER — ACETAMINOPHEN 325 MG PO TABS
650.0000 mg | ORAL_TABLET | ORAL | Status: DC | PRN
  Administered 2016-01-30 – 2016-02-02 (×4): 650 mg via ORAL
  Filled 2016-01-30 (×4): qty 2

## 2016-01-30 MED ORDER — VANCOMYCIN HCL IN DEXTROSE 1-5 GM/200ML-% IV SOLN
1000.0000 mg | INTRAVENOUS | Status: DC
  Administered 2016-01-31 – 2016-02-01 (×2): 1000 mg via INTRAVENOUS
  Filled 2016-01-30 (×2): qty 200

## 2016-01-30 MED ORDER — DULOXETINE HCL 30 MG PO CPEP
30.0000 mg | ORAL_CAPSULE | Freq: Every day | ORAL | Status: DC
  Administered 2016-01-30 – 2016-02-04 (×6): 30 mg via ORAL
  Filled 2016-01-30 (×6): qty 1

## 2016-01-30 MED ORDER — CEFTRIAXONE SODIUM 1 G IJ SOLR
1.0000 g | INTRAMUSCULAR | Status: DC
  Administered 2016-01-30 – 2016-02-01 (×3): 1 g via INTRAVENOUS
  Filled 2016-01-30 (×4): qty 10

## 2016-01-30 MED ORDER — DEXTROSE 5 % IV SOLN
INTRAVENOUS | Status: AC
  Filled 2016-01-30: qty 10

## 2016-01-30 MED ORDER — ENOXAPARIN SODIUM 30 MG/0.3ML ~~LOC~~ SOLN
30.0000 mg | SUBCUTANEOUS | Status: DC
  Administered 2016-01-30 – 2016-02-01 (×3): 30 mg via SUBCUTANEOUS
  Filled 2016-01-30 (×3): qty 0.3

## 2016-01-30 MED ORDER — HYDROCORTISONE NA SUCCINATE PF 100 MG IJ SOLR
100.0000 mg | Freq: Three times a day (TID) | INTRAMUSCULAR | Status: DC
  Administered 2016-01-30 – 2016-01-31 (×2): 100 mg via INTRAVENOUS
  Filled 2016-01-30 (×2): qty 2

## 2016-01-30 NOTE — H&P (Signed)
History and Physical    JACHOB MCCLEAN ZOX:[id number] DOB: [birth year] DOA: 01/30/2016  Referring MD/NP/PA: [name], MD PCP: Josue Hector, MD  Outpatient Specialists: Patient coming from: home  Chief Complaint: Chest pain and weakness  HPI: Patrick Tate is a 75 y.o. male with medical history significant of DM type 2, HTN, HLD, bilateral lower extremity amputations, who presents to the ED via EMS with complaints of shortness of breath, cough and generalized weakness for the past week. Patient is unaware of any fevers at home. He has a cough that is productive of clear mucous. He has substernal chest pain for the past week that occurs after coughing. He feels nauseous, but has not had vomiting. Bowel movements have been normal and no complaints of dysuria. He has been falling frequently and complains of pain in his left knee area. He has noted some erythema developing there over the past week.    While in the ED, his creatinine and BUN were notably elevated, above his baseline levels. His lactic acid was 2.43 and WBC count was 13.5. CXR was suspicious for PNA. Blood Cx were subsequently sent. He was also found to have a erythema over the right leg. He was found to be febrile with a temperature of 101.3. Since he was felt to be in sepsis, he was treated with IV fluids and abx. He was referred for admission.   Review of Systems: As per HPI otherwise 10 point review of systems negative.    Past Medical History  Diagnosis Date  . Diabetes mellitus without complication (HCC)   . Hypertension   . Hyperlipidemia     Past Surgical History  Procedure Laterality Date  . Amputee Bilateral   . Shoulder surgery    . Arm surgery       reports that he has never smoked. He does not have any smokeless tobacco history on file. He reports that he does not drink alcohol or use illicit drugs.  No Known Allergies  Family history: Family history reviewed and not pertinent   Prior to  Admission medications   Medication Sig Start Date End Date Taking? Authorizing Provider  ALPRAZolam Prudy Feeler) 0.5 MG tablet Take 0.5 mg by mouth at bedtime.    Historical Provider, MD  amLODipine (NORVASC) 5 MG tablet Take 5 mg by mouth daily.    Historical Provider, MD  aspirin EC 81 MG tablet Take 81 mg by mouth daily.    Historical Provider, MD  atorvastatin (LIPITOR) 20 MG tablet Take 20 mg by mouth daily.    Historical Provider, MD  azithromycin (ZITHROMAX) 250 MG tablet 2 tabs stat then 1  Qd x 4 days 07/02/14   Frederica Kuster, MD  Calcium Citrate-Vitamin D (CITRACAL + D PO) Take by mouth.    Historical Provider, MD  diltiazem (CARDIZEM) 90 MG tablet Take 90 mg by mouth 2 (two) times daily.    Historical Provider, MD  DULoxetine (CYMBALTA) 30 MG capsule Take 30 mg by mouth daily.    Historical Provider, MD  finasteride (PROSCAR) 5 MG tablet Take 5 mg by mouth daily.    Historical Provider, MD  insulin glargine (LANTUS) 100 UNIT/ML injection Inject 40 Units into the skin at bedtime.    Historical Provider, MD  insulin lispro (HUMALOG) 100 UNIT/ML injection Inject into the skin 3 (three) times daily before meals.    Historical Provider, MD  magnesium oxide (MAG-OX) 400 MG tablet Take 400 mg by mouth daily.    Historical  Provider, MD  Multiple Vitamin (MULTIVITAMIN) tablet Take 1 tablet by mouth daily.    Historical Provider, MD  predniSONE (DELTASONE) 20 MG tablet Take 20 mg by mouth daily with breakfast.    Historical Provider, MD    Physical Exam: Filed Vitals:   01/30/16 1715 01/30/16 1730 01/30/16 1745 01/30/16 1804  BP: 119/75 109/73 142/81   Pulse: 122 123 125   Temp:      TempSrc:      Resp: 40 33 29   Weight:      SpO2: 100% 100% 100% 100%      Constitutional: in respiratory distress, appears acutely ill  Filed Vitals:   01/30/16 1715 01/30/16 1730 01/30/16 1745 01/30/16 1804  BP: 119/75 109/73 142/81   Pulse: 122 123 125   Temp:      TempSrc:      Resp: 40 33 29     Weight:      SpO2: 100% 100% 100% 100%   Eyes: PERRL, lids and conjunctivae normal ENMT: Mucous membranes are dry. Posterior pharynx clear of any exudate or lesions.Normal dentition.  Neck: normal, supple, no masses, no thyromegaly Respiratory: bilateral wheezes with crackles at bases bilaterally. Increased respiratory effort. Using accessory muscles.  Cardiovascular: tachycardic, regular, no murmurs / rubs / gallops. 2+ pedal pulses. No carotid bruits.  Abdomen: no tenderness, no masses palpated. No hepatosplenomegaly. Bowel sounds positive.  Musculoskeletal: right BKA, left AKA. He has diffuse nonpitting edema Skin: erythema noted in right LE around knee and tracking up to his groin. Neurologic: CN 2-12 grossly intact. Sensation intact, DTR normal. Strength 5/5 in all 4.  Psychiatric: Normal judgment and insight. Alert and oriented x 3. Normal mood.    Labs on Admission: I have personally reviewed following labs and imaging studies  CBC:  Recent Labs Lab 01/30/16 0920  WBC 13.5*  NEUTROABS 11.7*  HGB 10.9*  HCT 36.9*  MCV 83.1  PLT 367   Basic Metabolic Panel:  Recent Labs Lab 01/30/16 0920  NA 139  K 3.6  CL 102  CO2 26  GLUCOSE 211*  BUN 29*  CREATININE 2.30*  CALCIUM 9.7   GFR: CrCl cannot be calculated (Unknown ideal weight.). Liver Function Tests:  Recent Labs Lab 01/30/16 0920  AST 17  ALT 18  ALKPHOS 96  BILITOT 0.6  PROT 6.4*  ALBUMIN 2.9*   No results for input(s): LIPASE, AMYLASE in the last 168 hours. No results for input(s): AMMONIA in the last 168 hours. Coagulation Profile: No results for input(s): INR, PROTIME in the last 168 hours. Cardiac Enzymes: No results for input(s): CKTOTAL, CKMB, CKMBINDEX, TROPONINI in the last 168 hours. BNP (last 3 results) No results for input(s): PROBNP in the last 8760 hours. HbA1C: No results for input(s): HGBA1C in the last 72 hours. CBG: No results for input(s): GLUCAP in the last 168  hours. Lipid Profile: No results for input(s): CHOL, HDL, LDLCALC, TRIG, CHOLHDL, LDLDIRECT in the last 72 hours. Thyroid Function Tests: No results for input(s): TSH, T4TOTAL, FREET4, T3FREE, THYROIDAB in the last 72 hours. Anemia Panel: No results for input(s): VITAMINB12, FOLATE, FERRITIN, TIBC, IRON, RETICCTPCT in the last 72 hours. Urine analysis:    Component Value Date/Time   COLORURINE YELLOW 07/10/2007 1447   APPEARANCEUR CLEAR 07/10/2007 1447   LABSPEC 1.019 07/10/2007 1447   PHURINE 5.5 07/10/2007 1447   GLUCOSEU NEGATIVE 07/10/2007 1447   HGBUR NEGATIVE 07/10/2007 1447   BILIRUBINUR NEGATIVE 07/10/2007 1447   KETONESUR TRACE* 07/10/2007 1447  PROTEINUR 30* 07/10/2007 1447   UROBILINOGEN 0.2 07/10/2007 1447   NITRITE NEGATIVE 07/10/2007 1447   LEUKOCYTESUR NEGATIVE 07/10/2007 1447   Sepsis Labs: @LABRCNTIP (procalcitonin:4,lacticidven:4) )No results found for this or any previous visit (from the past 240 hour(s)).   Radiological Exams on Admission: Dg Chest Port 1 View  01/30/2016  CLINICAL DATA:  Cough and fever EXAM: PORTABLE CHEST 1 VIEW COMPARISON:  08/23/2014 FINDINGS: Bibasilar airspace disease left greater than right. Negative for heart failure. No significant effusion identified. Heart size upper normal. IMPRESSION: Bibasilar airspace disease left greater than right. Findings are suspicious for pneumonia. Electronically Signed   By: 08/25/2014 M.D.   On: 01/30/2016 09:30    EKG: Independently reviewed. Sinus tachycardia. 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 Sepsis. Etiology is likely RLE cellulitis and CAP. He has been started on antibiotics and cultures have been sent. Lactic acid has trended down to normal range. He received a total of 3L of fluids in ED per sepsis order set. Blood pressure has normalized. Continue to follow closely  2. CAP. Chest xray indicates possible pneumonia and he is short of breath and coughing. He has been started on rocephin and azithromycin for CAP per pneumonia order set. Follow up cultures  3. RLE cellulitis. He has been started on vancomycin per cellulitis order set.   4. Acute resp failure with hypoxia. Currently on 4L of oxygen. He is not oxygen dependent at home. Resp failure is likely related to pneumonia. continue to follow.  5. AKI on CKD 3. Likely related to sepsis and hypovolemia. Will continue IVF and recheck labs in AM. Follow urine output  6. History of Rheumatoid arthritis. Patient is chronically on prednisone. Will start on stress dose steroids in the setting of sepsis.  7. Insulin dependent diabetes. Will continue on basal insulin and supplement with sliding scale  8. BPH. Continue finasteride  9. Anxiety. Continue home dose of xanax  10. HTN. Hold antihypertensives for now in the setting of sepsis. Resume as blood pressure will tolerate  11. HLD. Continue statin    DVT prophylaxis: lovenox Code Status: DNR, prognosis is gaurded Family Communication: discussed with wife at the bedside Disposition Plan: pending hospital course, may need placement Consults called:  Admission status: inpatient, stepdown   02/01/2016 MD [facility] Pager [phone]  If 7PM-7AM, please contact night-coverage [email or web address] Password [facility]  01/30/2016, 6:22 PM

## 2016-01-30 NOTE — ED Notes (Signed)
MD at bedside. 

## 2016-01-30 NOTE — Progress Notes (Addendum)
Patients chest xray report in computer to view. Patient refusing to wear bipap. Adjusted by RT, but patient continues to pull it off. Respirations 35-45. Bedtime Xanax given to help patient rest. O2 on by [place] at this time. Patient has only voided 175 ml since arriving to floor. Dr. Kerry Hough notified of all. Awaiting new orders.

## 2016-01-30 NOTE — Progress Notes (Signed)
Patient refusing to wear BIPAP machine. Still on standby at bedside if needed. RT will continue to monitor.

## 2016-01-30 NOTE — Progress Notes (Signed)
Integumentary: Bilateral arms brown discolorations and scattered bruising. Patrick Tate 3x3cm skin tear noted to right arm. Pink Foam applied. Stage 1 noted to sacrum. Pink foam applied. Large purple bruise noted to right upper buttocks.  Right stump noted to have 8 abraded areas with multiple sizes (1x1, 1x1, 1.5x1.5, 1.8x0.6, 1x.5, 1.5x1, .6x.5, 1.4x1). Left stump has a 0.4x0.3 area of abraded skin.  .7x.5 abraded area noted to center of back. Right arm and leg noted to be swollen. Right leg red. Scattered rash noted to trunk and bilateral thighs.

## 2016-01-30 NOTE — Progress Notes (Signed)
Patients heart rate 125 and respirations between 30 and 40. Dr. Kerry Hough notified via text page.

## 2016-01-30 NOTE — ED Notes (Signed)
EMS reports they were called out for respiratory distress but when they arrived, pt's 02 sat was 88% on room air.  EMS put pt on 4 liters and o2 sat increased to 99% but drops with any exertion.  Reports history of pneumonia.  Pt has a a cough for the past 3 weeks and wife says pt was "not acting right" this morning.  Says he was confused but ems says pt was able to answer their questions correctly.  CBG 256 per ems.  PT has weeping edema to r arm.  Pt has skin tear to r forearm when he arrived and to r leg.  Pt has left AKA and R BKA.  EMS gave pt 1 nitro without any relief.  Pt took aspirin at home this morning.

## 2016-01-30 NOTE — Progress Notes (Signed)
Pharmacy Antibiotic Note  Patrick Tate is a 75 y.o. male admitted on 01/30/2016 with pneumonia.  Pharmacy has been consulted for Vancomycin dosing.  Plan: Vancomycin 2000mg  loading dose, then 1000mg  IV every 24 hours.  Goal trough 15-20 mcg/mL.  Weight: 210 lb (95.255 kg)  Temp (24hrs), Avg:100.3 F (37.9 C), Min:98.8 F (37.1 C), Max:101.3 F (38.5 C)   Recent Labs Lab 01/30/16 0920 01/30/16 0921 01/30/16 1229  WBC 13.5*  --   --   CREATININE 2.30*  --   --   LATICACIDVEN  --  2.43* 1.31    CrCl cannot be calculated (Unknown ideal weight.).    No Known Allergies  Antimicrobials this admission: Azithromycin 5/22 >>  Ceftriaxone 5/22 >>  Vancomycin 5/22>>  Microbiology results: 5/22 BCx: pending 5/22 UCx: pending 5/22 MRSA PCR: pending  Thank you for allowing pharmacy to be a part of this patient's care.  6/22, BS Pharm D, 6/22 Clinical Pharmacist Pager [phone] 01/30/2016 6:22 PM

## 2016-01-30 NOTE — ED Provider Notes (Addendum)
CSN: [id number]     Arrival date & time 01/30/16  2505 History  By signing my name below, I, Patrick Tate, attest that this documentation has been prepared under the direction and in the presence of Patrick Porter, MD. Electronically Signed: Tanda Tate, ED Scribe. 01/30/2016. 9:35 AM.   Chief Complaint  Patient presents with  . Weakness  . Chest Pain   The history is provided by the patient. No language interpreter was used.    HPI Comments: Patrick Tate is a 75 y.o. male brought in by ambulance, with a PMHx DM, HTN, and HLD who presents to the Emergency Department complaining of gradual onset, intermittent, central chest pain x 1 week. Pt also complains of fever, chills, cough, and increased shortness of breath. He is not O2 dependent at home. Pt mentions having anxiety attacks with 2-3 episodes per day. He takes Xanax with some relief. Per triage report, wife called EMS for respiratory distress but when they arrived pt's O2 saturation was 89% on RA. He was put on 4L en route with an increase to 99% O2 saturation. Wife told EMS that pt was acting confused but he was able to answer all of EMS's questions appropriately. Pt was also given 1 NTG en route without relief. Pt has wound to right thigh but denies it looking worse or different recently. Denies nausea, vomiting, diaphoresis, or any other associated symptoms. No hx CAD, MI, or pleural effusion.   Past Medical History  Diagnosis Date  . Diabetes mellitus without complication (HCC)   . Hypertension   . Hyperlipidemia    Past Surgical History  Procedure Laterality Date  . Amputee Bilateral   . Shoulder surgery    . Arm surgery     No family history on file. Social History  Substance Use Topics  . Smoking status: Never Smoker   . Smokeless tobacco: None  . Alcohol Use: No    Review of Systems  Constitutional: Positive for fever and chills. Negative for diaphoresis, appetite change and fatigue.  HENT: Negative for mouth  sores, sore throat and trouble swallowing.   Eyes: Negative for visual disturbance.  Respiratory: Positive for cough and shortness of breath. Negative for chest tightness and wheezing.   Cardiovascular: Positive for chest pain.  Gastrointestinal: Negative for nausea, vomiting, abdominal pain, diarrhea and abdominal distention.  Endocrine: Negative for polydipsia, polyphagia and polyuria.  Genitourinary: Negative for dysuria, frequency and hematuria.  Musculoskeletal: Negative for gait problem.  Skin: Negative for color change, pallor and rash.  Neurological: Negative for dizziness, syncope, light-headedness and headaches.  Hematological: Does not bruise/bleed easily.  Psychiatric/Behavioral: Negative for behavioral problems and confusion.      Allergies  Review of patient's allergies indicates no known allergies.  Home Medications   Prior to Admission medications   Medication Sig Start Date End Date Taking? Authorizing Provider  ALPRAZolam Prudy Feeler) 0.5 MG tablet Take 0.5 mg by mouth at bedtime.    Historical Provider, MD  amLODipine (NORVASC) 5 MG tablet Take 5 mg by mouth daily.    Historical Provider, MD  aspirin EC 81 MG tablet Take 81 mg by mouth daily.    Historical Provider, MD  atorvastatin (LIPITOR) 20 MG tablet Take 20 mg by mouth daily.    Historical Provider, MD  azithromycin (ZITHROMAX) 250 MG tablet 2 tabs stat then 1  Qd x 4 days 07/02/14   Frederica Kuster, MD  Calcium Citrate-Vitamin D (CITRACAL + D PO) Take by mouth.  Historical Provider, MD  diltiazem (CARDIZEM) 90 MG tablet Take 90 mg by mouth 2 (two) times daily.    Historical Provider, MD  DULoxetine (CYMBALTA) 30 MG capsule Take 30 mg by mouth daily.    Historical Provider, MD  finasteride (PROSCAR) 5 MG tablet Take 5 mg by mouth daily.    Historical Provider, MD  insulin glargine (LANTUS) 100 UNIT/ML injection Inject 40 Units into the skin at bedtime.    Historical Provider, MD  insulin lispro (HUMALOG) 100  UNIT/ML injection Inject into the skin 3 (three) times daily before meals.    Historical Provider, MD  magnesium oxide (MAG-OX) 400 MG tablet Take 400 mg by mouth daily.    Historical Provider, MD  Multiple Vitamin (MULTIVITAMIN) tablet Take 1 tablet by mouth daily.    Historical Provider, MD  predniSONE (DELTASONE) 20 MG tablet Take 20 mg by mouth daily with breakfast.    Historical Provider, MD   BP 125/63 mmHg  Pulse 122  Temp(Src) 100.8 F (38.2 C) (Oral)  Resp 32  Wt 210 lb (95.255 kg)  SpO2 92%   Physical Exam  Constitutional: He is oriented to person, place, and time. He appears well-developed and well-nourished. No distress.  101.2 fever orally  HENT:  Head: Normocephalic.  Mouth/Throat: Mucous membranes are dry.  Tongue is dry and leathery  Eyes: Conjunctivae are normal. Pupils are equal, round, and reactive to light. No scleral icterus.  Neck: Normal range of motion. Neck supple. No thyromegaly present.  Cardiovascular: Normal rate and regular rhythm.  Exam reveals no gallop and no friction rub.   No murmur heard. Pulmonary/Chest: Tachypnea noted. No respiratory distress. He has no wheezes. He has no rales.  Globally diminished breath sounds and diminished in left base greater than right base.   Abdominal: Soft. Bowel sounds are normal. He exhibits no distension. There is no tenderness. There is no rebound.  Musculoskeletal: Normal range of motion.  Right BKA and left AKA.  Multiple wounds to the right inner distal thigh with cellulitis proximally.   Neurological: He is alert and oriented to person, place, and time.  Skin: Skin is warm and dry. No rash noted.  Psychiatric: He has a normal mood and affect. His behavior is normal.    ED Course  Procedures (including critical care time)  DIAGNOSTIC STUDIES: Oxygen Saturation is 91% on [place], low by my interpretation.    COORDINATION OF CARE: 9:33 AM-Discussed treatment plan which includes CXR, CMP, Lactic acid, CBC,  Blood culture, Urine culture, and UA with pt at bedside and pt agreed to plan.   Labs Review Labs Reviewed  COMPREHENSIVE METABOLIC PANEL - Abnormal; Notable for the following:    Glucose, Bld 211 (*)    BUN 29 (*)    Creatinine, Ser 2.30 (*)    Total Protein 6.4 (*)    Albumin 2.9 (*)    GFR calc non Af Amer 27 (*)    GFR calc Af Amer 31 (*)    All other components within normal limits  CBC WITH DIFFERENTIAL/PLATELET - Abnormal; Notable for the following:    WBC 13.5 (*)    Hemoglobin 10.9 (*)    HCT 36.9 (*)    MCH 24.5 (*)    MCHC 29.5 (*)    RDW 17.0 (*)    Neutro Abs 11.7 (*)    All other components within normal limits  I-STAT CG4 LACTIC ACID, ED - Abnormal; Notable for the following:    Lactic Acid, Venous  2.43 (*)    All other components within normal limits  CULTURE, BLOOD (ROUTINE X 2)  CULTURE, BLOOD (ROUTINE X 2)  URINE CULTURE  URINALYSIS, ROUTINE W REFLEX MICROSCOPIC (NOT AT [facility])    Imaging Review Dg Chest Port 1 View  01/30/2016  CLINICAL DATA:  Cough and fever EXAM: PORTABLE CHEST 1 VIEW COMPARISON:  08/23/2014 FINDINGS: Bibasilar airspace disease left greater than right. Negative for heart failure. No significant effusion identified. Heart size upper normal. IMPRESSION: Bibasilar airspace disease left greater than right. Findings are suspicious for pneumonia. Electronically Signed   By: Marlan Palau M.D.   On: 01/30/2016 09:30   I have personally reviewed and evaluated these images and lab results as part of my medical decision-making.   EKG Interpretation   Date/Time:  Monday Jan 30 2016 09:02:31 EDT Ventricular Rate:  126 PR Interval:  74 QRS Duration: 81 QT Interval:  347 QTC Calculation: 502 R Axis:   80 Text Interpretation:  Sinus or ectopic atrial tachycardia Borderline ST  depression, lateral leads Prolonged QT interval Confirmed by Fayrene Fearing  MD,  Yona Stansbury ([id number]) on 01/30/2016 9:09:00 AM      MDM   Final diagnoses:  Community acquired  pneumonia  Sepsis, due to unspecified organism (HCC)  Cellulitis of right lower extremity    Rectal temperature 101.3. Hypotensive and tachycardic with elevated lactate. Code sepsis initiated. Given 30 mL/kg IV fluid. With findings consistent with cellulitis and radiographic evidence of pneumonia covered for both. Given vancomycin should be good coverage for strep and staph, Zithromax for atypicals. Blood pressure improves to 125/63. Saturation on 2 L nasal cannula is 92-94%.  I have placed a call hospitalist to discuss admission.  CRITICAL CARE Performed by: Patrick Tate JOSEPH   Total critical care time: 60 minutes  Critical care time was exclusive of separately billable procedures and treating other patients.  Critical care was necessary to treat or prevent imminent or life-threatening deterioration.  Critical care was time spent personally by me on the following activities: development of treatment plan with patient and/or surrogate as well as nursing, discussions with consultants, evaluation of patient's response to treatment, examination of patient, obtaining history from patient or surrogate, ordering and performing treatments and interventions, ordering and review of laboratory studies, ordering and review of radiographic studies, pulse oximetry and re-evaluation of patient's condition.   I personally performed the services described in this documentation, which was scribed in my presence. The recorded information has been reviewed and is accurate.      Patrick Porter, MD 01/30/16 1108  Patrick Porter, MD 01/30/16 1126

## 2016-01-31 ENCOUNTER — Inpatient Hospital Stay (HOSPITAL_COMMUNITY): Payer: Medicare Other

## 2016-01-31 DIAGNOSIS — E785 Hyperlipidemia, unspecified: Secondary | ICD-10-CM

## 2016-01-31 LAB — BLOOD CULTURE ID PANEL (REFLEXED)
ACINETOBACTER BAUMANNII: NOT DETECTED
CANDIDA TROPICALIS: NOT DETECTED
CARBAPENEM RESISTANCE: NOT DETECTED
Candida albicans: NOT DETECTED
Candida glabrata: NOT DETECTED
Candida krusei: NOT DETECTED
Candida parapsilosis: NOT DETECTED
ENTEROCOCCUS SPECIES: NOT DETECTED
Enterobacter cloacae complex: NOT DETECTED
Enterobacteriaceae species: NOT DETECTED
Escherichia coli: NOT DETECTED
HAEMOPHILUS INFLUENZAE: NOT DETECTED
Klebsiella oxytoca: NOT DETECTED
Klebsiella pneumoniae: NOT DETECTED
LISTERIA MONOCYTOGENES: NOT DETECTED
METHICILLIN RESISTANCE: NOT DETECTED
NEISSERIA MENINGITIDIS: NOT DETECTED
PROTEUS SPECIES: NOT DETECTED
Pseudomonas aeruginosa: NOT DETECTED
SERRATIA MARCESCENS: NOT DETECTED
STAPHYLOCOCCUS SPECIES: NOT DETECTED
STREPTOCOCCUS AGALACTIAE: NOT DETECTED
STREPTOCOCCUS PNEUMONIAE: NOT DETECTED
STREPTOCOCCUS PYOGENES: NOT DETECTED
Staphylococcus aureus (BCID): NOT DETECTED
Streptococcus species: NOT DETECTED
VANCOMYCIN RESISTANCE: NOT DETECTED

## 2016-01-31 LAB — URINALYSIS, ROUTINE W REFLEX MICROSCOPIC
Bilirubin Urine: NEGATIVE
GLUCOSE, UA: NEGATIVE mg/dL
KETONES UR: NEGATIVE mg/dL
LEUKOCYTES UA: NEGATIVE
Nitrite: NEGATIVE
PROTEIN: NEGATIVE mg/dL
Specific Gravity, Urine: <1.005 — ABNORMAL LOW (ref 1.005–1.030)
pH: 5 (ref 5.0–8.0)

## 2016-01-31 LAB — CBC
HCT: 34.5 % — ABNORMAL LOW (ref 39.0–52.0)
HEMOGLOBIN: 10.3 g/dL — AB (ref 13.0–17.0)
MCH: 24.9 pg — ABNORMAL LOW (ref 26.0–34.0)
MCHC: 29.9 g/dL — AB (ref 30.0–36.0)
MCV: 83.3 fL (ref 78.0–100.0)
PLATELETS: 265 10*3/uL (ref 150–400)
RBC: 4.14 MIL/uL — ABNORMAL LOW (ref 4.22–5.81)
RDW: 17.1 % — ABNORMAL HIGH (ref 11.5–15.5)
WBC: 14.4 10*3/uL — ABNORMAL HIGH (ref 4.0–10.5)

## 2016-01-31 LAB — GLUCOSE, CAPILLARY
GLUCOSE-CAPILLARY: 162 mg/dL — AB (ref 65–99)
GLUCOSE-CAPILLARY: 260 mg/dL — AB (ref 65–99)
Glucose-Capillary: 334 mg/dL — ABNORMAL HIGH (ref 65–99)
Glucose-Capillary: 251 mg/dL — ABNORMAL HIGH (ref 65–99)

## 2016-01-31 LAB — HEMOGLOBIN A1C
HEMOGLOBIN A1C: 8.3 % — AB (ref 4.8–5.6)
MEAN PLASMA GLUCOSE: 192 mg/dL

## 2016-01-31 LAB — URINE MICROSCOPIC-ADD ON
SQUAMOUS EPITHELIAL / LPF: NONE SEEN
WBC UA: NONE SEEN WBC/hpf (ref 0–5)

## 2016-01-31 LAB — STREP PNEUMONIAE URINARY ANTIGEN: STREP PNEUMO URINARY ANTIGEN: NEGATIVE

## 2016-01-31 MED ORDER — HYDROCORTISONE NA SUCCINATE PF 100 MG IJ SOLR
50.0000 mg | Freq: Three times a day (TID) | INTRAMUSCULAR | Status: DC
Start: 2016-01-31 — End: 2016-02-01
  Administered 2016-01-31 – 2016-02-01 (×3): 50 mg via INTRAVENOUS
  Filled 2016-01-31 (×3): qty 2

## 2016-01-31 MED ORDER — FUROSEMIDE 10 MG/ML IJ SOLN
40.0000 mg | Freq: Once | INTRAMUSCULAR | Status: AC
  Administered 2016-01-31: 40 mg via INTRAVENOUS
  Filled 2016-01-31: qty 4

## 2016-01-31 MED ORDER — INSULIN GLARGINE 100 UNIT/ML ~~LOC~~ SOLN
35.0000 [IU] | Freq: Every day | SUBCUTANEOUS | Status: DC
  Administered 2016-01-31 – 2016-02-01 (×2): 35 [IU] via SUBCUTANEOUS
  Filled 2016-01-31 (×3): qty 0.35

## 2016-01-31 NOTE — Clinical Documentation Improvement (Signed)
Internal Medicine  When you review the case for the day  please clarify if the following diagnosis, Stage 1 sacrum ulcer  was:   Present at the time of admission (POA)  NOT present at the time of admission and it developed during the inpatient stay  Unable to clinically determine whether the condition was present on admission.  Unknown   Supporting Information:  "Stage 1 noted to sacrum. Pink foam applied"..  Per nurses note on 5/22/2:42 pm     Please exercise your independent, professional judgment when responding. A specific answer is not anticipated or expected.   Thank You,  Lavonda Jumbo [employer] [facility] [place] [phone]

## 2016-01-31 NOTE — Progress Notes (Signed)
PROGRESS NOTE    Patrick Tate  EPP:[id number] DOB: [birth year] DOA: 01/30/2016 PCP: Josue Hector, MD   Brief Narrative:  48 yom admitted to the hospital with sepsis related to CAP and RLE cellulitis. He has been started on broad spectrum antibiotics and cultures have been sent. Respiratory status appears to be improving today. He will likely be in the hospital for 2-3 more days.    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 Sepsis, secondary to RLE cellulitis and CAP. On admission pt noted to be febrile with elevated lactic acid and WBC. CXR revealed CAP and UA negative. BC in process. Started on IV abx and IVF per sepsis protocol. He is now afebrile. Follow up cultures. Since patient is prednisone dependent, he was started on stress dose steroids. Will start weaning hydrocortisone today. 2. CAP, revealed on CXR. Started on IV abx with rocephin and azithromycin. He continues to cough and produce sputum. Continue to monitor.  3. Acute respiratory failure with hypoxia. Likely related to CAP. Will continue to wean oxygen as tolerated.  4. Cellulitis of right leg. Started on Vanc per celluliits  protocol. Erythema slowly improving. Continue to monitor. 5. AKI on CKD Stage III. Likely related to sepsis and hypovolemia. Continue to monitor.  6. Hx of rheumatoid arthritis. Chronically on prednisone.  7. IDDM. Continue basal insulin and SSI. Blood sugars high, likely related to steroids. Will increase lantus 8. BPH. Continue finasteride. 9. Anxiety. Continue home dose of  Xanax.  10. Essential HTN. Antihypertensives held in the setting of sepsis. Blood pressure currently stable. Restart as blood pressure will tolerate. 11. HLD. Conitnue statins.     DVT prophylaxis: Lovenox  Code Status: DNR Family Communication: discussed with patient Disposition Plan: discharge plan to be determined. May need SNF placement   Consultants:   None  Procedures:  None  Antimicrobials:    Zithromax 5/22>>  Vancomycin 5/22>>  Rocephin 5/22>>   Subjective: Breathing is better today. He is coughing. No vomiting.  Objective: Filed Vitals:   01/31/16 0400 01/31/16 0500 01/31/16 0600 01/31/16 0712  BP: 108/63 111/59 125/81   Pulse: 47 38 87   Temp: 98.4 F (36.9 C) 98.4 F (36.9 C) 98.6 F (37 C)   TempSrc:      Resp:      Height:      Weight:  97 kg (213 lb 13.5 oz)    SpO2: 99% 100% 99% 100%    Intake/Output Summary (Last 24 hours) at 01/31/16 0745 Last data filed at 01/30/16 1753  Gross per 24 hour  Intake      0 ml  Output    200 ml  Net   -200 ml   Filed Weights   01/30/16 0903 01/31/16 0500  Weight: 95.255 kg (210 lb) 97 kg (213 lb 13.5 oz)    Examination:  General exam: Appears calm and comfortable  Respiratory system: crackles at bases. Respiratory effort appears improved today. Cardiovascular system: S1 & S2 heard, RRR. No JVD, murmurs, rubs, gallops or clicks.  Gastrointestinal  system: Abdomen is nondistended, soft and nontender. No organomegaly or masses felt. Normal bowel sounds heard. Central nervous system: Alert and oriented. No focal neurological deficits. Extremities: right BKA, left AKA, he has diffuse non pitting edema. Skin: erythema over right leg appears mildly improving Psychiatry: Judgement and insight appear normal. Mood & affect appropriate.     Data Reviewed: I have personally reviewed following labs and imaging studies  CBC:  Recent Labs Lab 01/30/16 0920 01/30/16 2039  WBC 13.5* 11.1*  NEUTROABS 11.7*   --   HGB 10.9* 9.9*  HCT 36.9* 33.1*  MCV 83.1 82.5  PLT 367 225   Basic Metabolic Panel:  Recent Labs Lab 01/30/16 0920 01/30/16 2039  NA 139 137  K 3.6 4.2  CL 102 109  CO2 26 22  GLUCOSE 211* 162*  BUN 29* 29*  CREATININE 2.30* 2.29*  CALCIUM 9.7 8.2*   GFR: Estimated Creatinine Clearance: 28 mL/min (by C-G formula based on Cr of 2.29). Liver Function Tests:  Recent Labs Lab 01/30/16 0920 01/30/16 2039  AST 17 16  ALT 18 16*  ALKPHOS 96 70  BILITOT 0.6 0.6  PROT 6.4* 5.0*  ALBUMIN 2.9* 2.3*   No results for input(s): LIPASE, AMYLASE in the last 168 hours. No results for input(s): AMMONIA in the last 168 hours. Coagulation Profile: No results for input(s): INR, PROTIME in the last 168 hours. Cardiac Enzymes: No results for input(s): CKTOTAL, CKMB, CKMBINDEX, TROPONINI in the last 168 hours. BNP (last 3 results) No results for input(s): PROBNP in the last 8760 hours. HbA1C:  Recent Labs  01/30/16 0920  HGBA1C 8.3*   CBG:  Recent Labs Lab 01/30/16 1849 01/30/16 2125  GLUCAP 167* 164*   Lipid Profile: No results for input(s): CHOL, HDL, LDLCALC, TRIG, CHOLHDL, LDLDIRECT in the last 72 hours. Thyroid Function Tests: No results for input(s): TSH, T4TOTAL, FREET4, T3FREE, THYROIDAB in the last 72 hours. Anemia Panel: No results for input(s): VITAMINB12, FOLATE, FERRITIN, TIBC, IRON, RETICCTPCT in the last 72 hours. Urine analysis:    Component Value Date/Time   COLORURINE YELLOW 01/31/2016 0728   APPEARANCEUR CLEAR 01/31/2016 0728   LABSPEC <1.005* 01/31/2016 0728   PHURINE 5.0 01/31/2016 0728   GLUCOSEU NEGATIVE 01/31/2016 0728   HGBUR TRACE* 01/31/2016 0728   BILIRUBINUR NEGATIVE 01/31/2016 0728   KETONESUR NEGATIVE 01/31/2016 0728   PROTEINUR NEGATIVE 01/31/2016 0728   UROBILINOGEN 0.2 07/10/2007 1447   NITRITE NEGATIVE 01/31/2016 0728   LEUKOCYTESUR NEGATIVE 01/31/2016 0728   Sepsis  Labs: @LABRCNTIP (procalcitonin:4,lacticidven:4)  ) Recent Results (from the past 240 hour(s))  MRSA PCR Screening     Status: None   Collection Time: 01/30/16  1:15 PM  Result Value Ref Range Status   MRSA by PCR NEGATIVE NEGATIVE Final    Comment:        The GeneXpert MRSA Assay (FDA approved for NASAL specimens only), is one component of a comprehensive MRSA colonization surveillance program. It is not intended to diagnose MRSA infection nor to guide or monitor treatment for MRSA infections.          Radiology Studies: Dg Chest Port 1 View  01/30/2016  CLINICAL DATA:  Shortness of breath. EXAM: PORTABLE CHEST 1 VIEW COMPARISON:  Same day. FINDINGS: Stable cardiomediastinal silhouette. No pneumothorax is noted. Increased left perihilar and basilar opacity is noted as well as increased right basilar opacity concerning for atelectasis or infiltrates with associated pleural effusion. Bony thorax is unremarkable. IMPRESSION: Mildly increased bilateral basilar lung opacities are noted  concerning for worsening atelectasis or pneumonia. Electronically Signed   By: Lupita Raider, M.D.   On: 01/30/2016 18:33   Dg Chest Port 1 View  01/30/2016  CLINICAL DATA:  Cough and fever EXAM: PORTABLE CHEST 1 VIEW COMPARISON:  08/23/2014 FINDINGS: Bibasilar airspace disease left greater than right. Negative for heart failure. No significant effusion identified. Heart size upper normal. IMPRESSION: Bibasilar airspace disease left greater than right. Findings are suspicious for pneumonia. Electronically Signed   By: Marlan Palau M.D.   On: 01/30/2016 09:30        Scheduled Meds: . ALPRAZolam  0.5 mg Oral QHS  . antiseptic oral rinse  7 mL Mouth Rinse BID  . aspirin EC  81 mg Oral Daily  . atorvastatin  20 mg Oral q1800  . azithromycin  500 mg Intravenous Q24H  . cefTRIAXone (ROCEPHIN)  IV  1 g Intravenous Q24H  . DULoxetine  30 mg Oral Daily  . enoxaparin (LOVENOX) injection  30 mg  Subcutaneous Q24H  . finasteride  5 mg Oral Daily  . furosemide  40 mg Intravenous Once  . guaiFENesin  1,200 mg Oral BID  . hydrocortisone sod succinate (SOLU-CORTEF) inj  50 mg Intravenous Q8H  . insulin aspart  0-15 Units Subcutaneous TID WC  . insulin aspart  0-5 Units Subcutaneous QHS  . insulin glargine  30 Units Subcutaneous QHS  . ipratropium-albuterol  3 mL Nebulization Q6H  . vancomycin  1,000 mg Intravenous Q24H   Continuous Infusions:    LOS: 1 day    Time spent: 25 mins    Erick Blinks, MD  [facility] Pager [phone]  If 7PM-7AM, please contact night-coverage [email or web address] Password [facility] 01/31/2016, 7:45 AM

## 2016-01-31 NOTE — Care Management Note (Signed)
Case Management Note  Patient Details  Name: Patrick Tate MRN: [id number] Date of Birth: 1941/07/31  Subjective/Objective:                  Pt admitted with sepsis. Pt is from home, lives with his wife and is ind with ADL's at baseline. Pt has bilateral LE amputation and is wheelchair bound. Pt has BSC and WC but no hopsital bed. Pt has no respiratory DME. Pt's wife states if he is close to his baseline she plans to him to come home. If he needs HH they have used [facility] in the past and would like to use them again.   Action/Plan: Will need PT eval prior to DC. Will cont to follow.   Expected Discharge Date:     02/06/2016             Expected Discharge Plan:  Home w Home Health Services  In-House Referral:  NA  Discharge planning Services  CM Consult  Post Acute Care Choice:  Home Health Choice offered to:  Spouse, Patient  DME Arranged:    DME Agency:     HH Arranged:    HH Agency:  [facility]  Status of Service:  In process, will continue to follow  Medicare Important Message Given:    Date Medicare IM Given:    Medicare IM give by:    Date Additional Medicare IM Given:    Additional Medicare Important Message give by:     If discussed at Long Length of Stay Meetings, dates discussed:    Additional Comments:  Malcolm Metro, RN 01/31/2016, 2:52 PM

## 2016-02-01 ENCOUNTER — Encounter (HOSPITAL_COMMUNITY): Payer: Self-pay | Admitting: Internal Medicine

## 2016-02-01 DIAGNOSIS — Z89612 Acquired absence of left leg above knee: Secondary | ICD-10-CM

## 2016-02-01 DIAGNOSIS — A419 Sepsis, unspecified organism: Secondary | ICD-10-CM

## 2016-02-01 DIAGNOSIS — L03115 Cellulitis of right lower limb: Secondary | ICD-10-CM

## 2016-02-01 DIAGNOSIS — J189 Pneumonia, unspecified organism: Secondary | ICD-10-CM

## 2016-02-01 DIAGNOSIS — L98429 Non-pressure chronic ulcer of back with unspecified severity: Secondary | ICD-10-CM

## 2016-02-01 DIAGNOSIS — A499 Bacterial infection, unspecified: Secondary | ICD-10-CM

## 2016-02-01 DIAGNOSIS — E118 Type 2 diabetes mellitus with unspecified complications: Secondary | ICD-10-CM

## 2016-02-01 DIAGNOSIS — L89151 Pressure ulcer of sacral region, stage 1: Secondary | ICD-10-CM

## 2016-02-01 DIAGNOSIS — R7881 Bacteremia: Secondary | ICD-10-CM | POA: Diagnosis present

## 2016-02-01 HISTORY — DX: Non-pressure chronic ulcer of back with unspecified severity: L98.429

## 2016-02-01 LAB — GLUCOSE, CAPILLARY
Glucose-Capillary: 198 mg/dL — ABNORMAL HIGH (ref 65–99)
Glucose-Capillary: 253 mg/dL — ABNORMAL HIGH (ref 65–99)
Glucose-Capillary: 222 mg/dL — ABNORMAL HIGH (ref 65–99)
Glucose-Capillary: 270 mg/dL — ABNORMAL HIGH (ref 65–99)

## 2016-02-01 LAB — BASIC METABOLIC PANEL
ANION GAP: 8 (ref 5–15)
BUN: 38 mg/dL — ABNORMAL HIGH (ref 6–20)
CALCIUM: 8 mg/dL — AB (ref 8.9–10.3)
CHLORIDE: 103 mmol/L (ref 101–111)
CO2: 26 mmol/L (ref 22–32)
Creatinine, Ser: 2.39 mg/dL — ABNORMAL HIGH (ref 0.61–1.24)
GFR, EST AFRICAN AMERICAN: 29 mL/min — AB (ref >60)
GFR, EST NON AFRICAN AMERICAN: 25 mL/min — AB (ref >60)
Glucose, Bld: 206 mg/dL — ABNORMAL HIGH (ref 65–99)
Potassium: 3.7 mmol/L (ref 3.5–5.1)
SODIUM: 137 mmol/L (ref 135–145)

## 2016-02-01 LAB — URINE CULTURE: Culture: NO GROWTH

## 2016-02-01 LAB — CBC
HCT: 28.6 % — ABNORMAL LOW (ref 39.0–52.0)
Hemoglobin: 8.7 g/dL — ABNORMAL LOW (ref 13.0–17.0)
MCH: 25 pg — ABNORMAL LOW (ref 26.0–34.0)
MCHC: 30.4 g/dL (ref 30.0–36.0)
MCV: 82.2 fL (ref 78.0–100.0)
Platelets: 268 10*3/uL (ref 150–400)
RBC: 3.48 MIL/uL — ABNORMAL LOW (ref 4.22–5.81)
RDW: 17 % — AB (ref 11.5–15.5)
WBC: 13.4 10*3/uL — ABNORMAL HIGH (ref 4.0–10.5)

## 2016-02-01 MED ORDER — IPRATROPIUM-ALBUTEROL 0.5-2.5 (3) MG/3ML IN SOLN: 3.0000 mL | Freq: Four times a day (QID) | RESPIRATORY_TRACT | Status: DC | PRN

## 2016-02-01 MED ORDER — HYDROCORTISONE NA SUCCINATE PF 100 MG IJ SOLR: 25.0000 mg | Freq: Three times a day (TID) | INTRAMUSCULAR | Status: DC

## 2016-02-01 MED ORDER — HYDROCORTISONE NA SUCCINATE PF 100 MG IJ SOLR
25.0000 mg | Freq: Two times a day (BID) | INTRAMUSCULAR | Status: DC
  Administered 2016-02-01 – 2016-02-03 (×5): 25 mg via INTRAVENOUS
  Filled 2016-02-01 (×5): qty 2

## 2016-02-01 MED ORDER — IPRATROPIUM-ALBUTEROL 0.5-2.5 (3) MG/3ML IN SOLN
3.0000 mL | Freq: Three times a day (TID) | RESPIRATORY_TRACT | Status: DC
  Administered 2016-02-01 – 2016-02-04 (×10): 3 mL via RESPIRATORY_TRACT
  Filled 2016-02-01 (×11): qty 3

## 2016-02-01 NOTE — Progress Notes (Addendum)
PROGRESS NOTE    Patrick Tate  RJJ:[id number] DOB: 29-Oct-1940 DOA: 01/30/2016 PCP: Josue Hector, MD   Brief Narrative:  75 -year-old man with a history of diabetes mellitus, bilateral lower extremity amputee, and hypertension, who was admitted to the hospital with sepsis related to CAP and RLE cellulitis.   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 Sepsis with gram-positive bacteremia, secondary to RLE cellulitis and CAP On admission , the patient was febrile with a temperature of 101.3, relatively hypotensive, and tachycardic. His lactic acid was elevated and his white blood cell count was 13.5.  CXR revealed left greater than right airspace disease consistent with pneumonia. His  UA  was essentially negative. He was started on IV Rocephin and azithromycin. Vigorous IV fluids were given. Stress dose hydrocortisone was started due to his chronic prednisone therapy for RA. -Strep pneumo antigen was negative. -Blood cultures ordered and one out of 2 just became positive for gram-positive cocci. Patient already on vancomycin for cellulitis. -Sepsis parameters are improving. Leukocytosis persisting, likely secondary to IV steroid.  Community-acquired pneumonia. -Treatment started as above in #1. Patient has a minimal nonproductive cough.  Acute respiratory failure with hypoxia, secondary to CAP. Patient's oxygen saturation dipped down to 88% on room air in the ED.  Supplemental oxygen was applied. His oxygen saturations have improved.  Right lower extremity cellulitis. Patient was started on vancomycin for treatment. His erythema appears to be improving.  Insulin-dependent diabetes mellitus. Patient is treated with Lantus and sliding scale Humalog at home. His blood sugars have increased on IV hydrocortisone. Dose of Lantus was increased on 01/31/16. -We'll decrease IV hydrocortisone which will help with his CBGs. -Patient's A1c was 8.3.  Acute kidney injury superimposed on stage III chronic kidney disease. Patient's creatinine was 2.3 on admission. 11 months ago was 1.72 and one year ago it was 2.18. Patient was vigorously hydrated for the first 24 hours. His creatinine has not improved much. -Etiology may be ATN due to sepsis. We'll continue to monitor closely.  Normocytic anemia. Patient's hemoglobin has drifted down to 8.7. -We'll Hemoccult his stools and order some anemia studies.  Rheumatoid arthritis. Patient is treated with chronic prednisone. He was started on stress dose IV hydrocortisone. -We'll decrease IV hydrocortisone and eventually taper back to home dose of redness on.  Hypertension. Patient is treated chronically with amlodipine and diltiazem. They were both placed on hold on admission due to patient's systolic blood pressure being in the 90s. His blood pressure has improved, but we'll still hold his antihypertensive medications.  Right upper extremity edema. Venous Doppler ultrasound was negative for DVT. Etiology may be secondary to the blood pressure cuff impeding the venous flow. RN notified and a larger cuff was requested; elevate right arm.  BPH. He was continued on finasteride.  Anxiety. He was continued on Cymbalta and Xanax.  Stage I sacral ulcer. Barrier management per nursing.       DVT prophylaxis: Lovenox  Code Status: DNR Family Communication: Discussed with patient. Disposition Plan: Discharge when  clinically appropriate. He may need short-term SNF.   Consultants:   None  Procedures:  None  Antimicrobials:    Zithromax 5/22>>  Vancomycin 5/22>>  Rocephin 5/22>>   Subjective: Patient denies chest pain or shortness of breath. He has a mild nonproductive cough.  Objective: Filed Vitals:   02/01/16 0200 02/01/16 0300 02/01/16 0400 02/01/16 0500  BP: 121/70 103/67 112/72 106/72  Pulse: 84 86 85 76  Temp: 97.2 F (36.2 C) 97 F (36.1 C) 97 F (36.1 C) 97.1 F (36.2 C)  TempSrc:      Resp: Height:      Weight:    95.5 kg (210 lb 8.6 oz)  SpO2: 99% 100% 100% 100%    Intake/Output Summary (Last 24 hours) at 02/01/16 0842 Last data filed at 02/01/16 0500  Gross per 24 hour  Intake    480 ml  Output   2100 ml  Net  -1620 ml   Filed Weights   01/30/16 0903 01/31/16 0500 02/01/16 0500  Weight: 95.255 kg (210 lb) 97 kg (213 lb 13.5 oz) 95.5 kg (210 lb 8.6 oz)    Examination:  General exam: Appears calm and comfortable  Respiratory system: Rare crackles at the bases; breathing nonlabored and clear otherwise. Cardiovascular system: S1 & S2 heard with a 2/6 systolic murmur. Gastrointestinal system: Abdomen is nondistended, soft and nontender. No organomegaly or masses felt. Normal bowel sounds heard. Central nervous system: Alert and oriented. No focal neurological deficits. Extremities: right lower extremity stump with scant erythema and trace to 1+ lower extremity edema. Left stump with trace of edema. Right upper extremity with 1+ pitting edema and trace of left upper extremity edema. Skin: erythema over right lower extremity stump with mild erythema. Psychiatry: Judgement and insight appear normal. Mood & affect appropriate.     Data Reviewed: I have personally reviewed following labs and imaging studies  CBC:  Recent Labs Lab 01/30/16 0920 01/30/16 2039 01/31/16 0837 02/01/16 0351  WBC 13.5* 11.1* 14.4* 13.4*  NEUTROABS 11.7*  --   --    --   HGB 10.9* 9.9* 10.3* 8.7*  HCT 36.9* 33.1* 34.5* 28.6*  MCV 83.1 82.5 83.3 82.2  PLT 367 225 265 268   Basic Metabolic Panel:  Recent Labs Lab 01/30/16 0920 01/30/16 2039 02/01/16 0351  NA 139 137 137  K 3.6 4.2 3.7  CL 102 109 103  CO2 GLUCOSE 211* 162* 206*  BUN 29* 29* 38*  CREATININE 2.30* 2.29* 2.39*  CALCIUM 9.7 8.2* 8.0*   GFR: Estimated Creatinine Clearance: 26.6 mL/min (by C-G formula based on Cr of 2.39). Liver Function Tests:  Recent Labs Lab 01/30/16 0920 01/30/16 2039  AST 17 16  ALT 18 16*  ALKPHOS 96 70  BILITOT 0.6 0.6  PROT 6.4* 5.0*  ALBUMIN 2.9* 2.3*   No results for input(s): LIPASE, AMYLASE in the last 168 hours. No results for input(s): AMMONIA in the last 168 hours. Coagulation Profile: No results for input(s): INR, PROTIME in the last 168 hours. Cardiac Enzymes: No results for input(s): CKTOTAL, CKMB, CKMBINDEX, TROPONINI in the last 168 hours. BNP (last 3 results) No results for input(s): PROBNP in the last 8760 hours. HbA1C:  Recent Labs  01/30/16 0920  HGBA1C 8.3*   CBG:  Recent Labs Lab 01/31/16 0806 01/31/16 1120 01/31/16 1614 01/31/16 2105 02/01/16 0754  GLUCAP 162* 260*  334* 251* 198*   Lipid Profile: No results for input(s): CHOL, HDL, LDLCALC, TRIG, CHOLHDL, LDLDIRECT in the last 72 hours. Thyroid Function Tests: No results for input(s): TSH, T4TOTAL, FREET4, T3FREE, THYROIDAB in the last 72 hours. Anemia Panel: No results for input(s): VITAMINB12, FOLATE, FERRITIN, TIBC, IRON, RETICCTPCT in the last 72 hours. Urine analysis:    Component Value Date/Time   COLORURINE YELLOW 01/31/2016 0728   APPEARANCEUR CLEAR 01/31/2016 0728   LABSPEC <1.005* 01/31/2016 0728   PHURINE 5.0 01/31/2016 0728   GLUCOSEU NEGATIVE 01/31/2016 0728   HGBUR TRACE* 01/31/2016 0728   BILIRUBINUR NEGATIVE 01/31/2016 0728   KETONESUR NEGATIVE 01/31/2016 0728   PROTEINUR NEGATIVE 01/31/2016 0728   UROBILINOGEN 0.2  07/10/2007 1447   NITRITE NEGATIVE 01/31/2016 0728   LEUKOCYTESUR NEGATIVE 01/31/2016 0728   Sepsis Labs: @LABRCNTIP (procalcitonin:4,lacticidven:4)  ) Recent Results (from the past 240 hour(s))  Culture, blood (Routine x 2)     Status: None (Preliminary result)   Collection Time: 01/30/16  9:20 AM  Result Value Ref Range Status   Specimen Description BLOOD  Final   Special Requests NONE  Final   Culture NO GROWTH 1 DAY  Final   Report Status PENDING  Incomplete  Culture, blood (Routine x 2)     Status: None (Preliminary result)   Collection Time: 01/30/16  9:30 AM  Result Value Ref Range Status   Specimen Description BLOOD RIGHT ANTECUBITAL  Final   Special Requests BOTTLES DRAWN AEROBIC AND ANAEROBIC 6CC EACH  Final   Culture  Setup Time   Final    GRAM POSITIVE COCCI ANAEROBIC BOTTLE ONLY Gram Stain Report Called to,Read Back By and Verified With: MORRIS,C ON 01/31/16 @0840  BY MITCHELL,S    Culture GRAM POSITIVE COCCI  Final   Report Status PENDING  Incomplete  Blood Culture ID Panel (Reflexed)     Status: None   Collection Time: 01/30/16  9:30 AM  Result Value Ref Range Status   Enterococcus species NOT DETECTED NOT DETECTED Final   Vancomycin resistance NOT DETECTED NOT DETECTED Final   Listeria monocytogenes NOT DETECTED NOT DETECTED Final   Staphylococcus species NOT DETECTED NOT DETECTED Final   Staphylococcus aureus NOT DETECTED NOT DETECTED Final   Methicillin resistance NOT DETECTED NOT DETECTED Final   Streptococcus species NOT DETECTED NOT DETECTED Final   Streptococcus agalactiae NOT DETECTED NOT DETECTED Final   Streptococcus pneumoniae NOT DETECTED NOT DETECTED Final   Streptococcus pyogenes NOT DETECTED NOT DETECTED Final   Acinetobacter baumannii NOT DETECTED NOT DETECTED Final   Enterobacteriaceae species NOT DETECTED NOT DETECTED Final   Enterobacter cloacae complex NOT DETECTED NOT DETECTED Final   Escherichia coli NOT DETECTED NOT DETECTED Final    Klebsiella oxytoca NOT DETECTED NOT DETECTED Final   Klebsiella pneumoniae NOT DETECTED NOT DETECTED Final   Proteus species NOT DETECTED NOT DETECTED Final   Serratia marcescens NOT DETECTED NOT DETECTED Final   Carbapenem resistance NOT DETECTED NOT DETECTED Final   Haemophilus influenzae NOT DETECTED NOT DETECTED Final   Neisseria meningitidis NOT DETECTED NOT DETECTED Final   Pseudomonas aeruginosa NOT DETECTED NOT DETECTED Final   Candida albicans NOT DETECTED NOT DETECTED Final   Candida glabrata NOT DETECTED NOT DETECTED Final   Candida krusei NOT DETECTED NOT DETECTED Final   Candida parapsilosis NOT DETECTED NOT DETECTED Final   Candida tropicalis NOT DETECTED NOT DETECTED Final    Comment: Performed at [facility]  MRSA PCR Screening     Status: None  Collection Time: 01/30/16  1:15 PM  Result Value Ref Range Status   MRSA by PCR NEGATIVE NEGATIVE Final    Comment:        The GeneXpert MRSA Assay (FDA approved for NASAL specimens only), is one component of a comprehensive MRSA colonization surveillance program. It is not intended to diagnose MRSA infection nor to guide or monitor treatment for MRSA infections.   Urine culture     Status: None   Collection Time: 01/30/16  9:40 PM  Result Value Ref Range Status   Specimen Description URINE, CATHETERIZED  Final   Special Requests NONE  Final   Culture NO GROWTH Performed at [facility]   Final   Report Status 02/01/2016 FINAL  Final         Radiology Studies: US Venous Img Upper Uni Right  01/31/2016  CLINICAL DATA:  Right upper extremity edema and shortness of breath. History of diabetes. Evaluate for DVT. EXAM: RIGHT UPPER EXTREMITY VENOUS DOPPLER ULTRASOUND TECHNIQUE: Gray-scale sonography with graded compression, as well as color Doppler and duplex ultrasound were performed to evaluate the upper extremity deep venous system from the level of the subclavian vein and including the jugular,  axillary, basilic, radial, ulnar and upper cephalic vein. Spectral Doppler was utilized to evaluate flow at rest and with distal augmentation maneuvers. COMPARISON:  None. FINDINGS: Contralateral Subclavian Vein: Respiratory phasicity is normal and symmetric with the symptomatic side. No evidence of thrombus. Normal compressibility. Internal Jugular Vein: No evidence of thrombus. Normal compressibility, respiratory phasicity and response to augmentation. Subclavian Vein: No evidence of thrombus. Normal compressibility, respiratory phasicity and response to augmentation. Axillary Vein: No evidence of thrombus. Normal compressibility, respiratory phasicity and response to augmentation. Cephalic Vein: No evidence of thrombus. Normal compressibility, respiratory phasicity and response to augmentation. Basilic Vein: No evidence of thrombus. Normal compressibility, respiratory phasicity and response to augmentation. Brachial Veins: No evidence of thrombus. Normal compressibility, respiratory phasicity and response to augmentation. Radial Veins: No evidence of thrombus. Normal compressibility, respiratory phasicity and response to augmentation. Ulnar Veins: No evidence of thrombus. Normal compressibility, respiratory phasicity and response to augmentation. Venous Reflux:  None visualized. Other Findings: A minimal amount of subcutaneous edema is noted throughout the arm. IMPRESSION: No evidence of DVT within the right upper extremity. Electronically Signed   By: Simonne Come M.D.   On: 01/31/2016 11:26   Dg Chest Port 1 View  01/30/2016  CLINICAL DATA:  Shortness of breath. EXAM: PORTABLE CHEST 1 VIEW COMPARISON:  Same day. FINDINGS: Stable cardiomediastinal silhouette. No pneumothorax is noted. Increased left perihilar and basilar opacity is noted as well as increased right basilar opacity concerning for atelectasis or infiltrates with associated pleural effusion. Bony thorax is unremarkable. IMPRESSION: Mildly  increased bilateral basilar lung opacities are noted concerning for worsening atelectasis or pneumonia. Electronically Signed   By: Lupita Raider, M.D.   On: 01/30/2016 18:33   Dg Chest Port 1 View  01/30/2016  CLINICAL DATA:  Cough and fever EXAM: PORTABLE CHEST 1 VIEW COMPARISON:  08/23/2014 FINDINGS: Bibasilar airspace disease left greater than right. Negative for heart failure. No significant effusion identified. Heart size upper normal. IMPRESSION: Bibasilar airspace disease left greater than right. Findings are suspicious for pneumonia. Electronically Signed   By: Marlan Palau M.D.   On: 01/30/2016 09:30        Scheduled Meds: . ALPRAZolam  0.5 mg Oral QHS  . antiseptic oral rinse  7 mL Mouth Rinse BID  . aspirin  EC  81 mg Oral Daily  . atorvastatin  20 mg Oral q1800  . azithromycin  500 mg Intravenous Q24H  . cefTRIAXone (ROCEPHIN)  IV  1 g Intravenous Q24H  . DULoxetine  30 mg Oral Daily  . enoxaparin (LOVENOX) injection  30 mg Subcutaneous Q24H  . finasteride  5 mg Oral Daily  . guaiFENesin  1,200 mg Oral BID  . hydrocortisone sod succinate (SOLU-CORTEF) inj  50 mg Intravenous Q8H  . insulin aspart  0-15 Units Subcutaneous TID WC  . insulin aspart  0-5 Units Subcutaneous QHS  . insulin glargine  35 Units Subcutaneous QHS  . ipratropium-albuterol  3 mL Nebulization TID  . vancomycin  1,000 mg Intravenous Q24H   Continuous Infusions:    LOS: 2 days    Time spent: 30 mins    Joanette Gula, M.D. [facility] Pager [phone]  If 7PM-7AM, please contact night-coverage [email or web address] Password [facility] 02/01/2016, 8:42 AM

## 2016-02-01 NOTE — Progress Notes (Signed)
Pt left the floor with nursing staff.  Pt in no distress.  Belongings, paper chart, and medications sent with patient.  Report called to Dept. 300 RN, Shanda Bumps.   Vitals signs as follows:  Temp: 97.1 F (36.2 C) (05/24 0500) BP: 106/72 mmHg (05/24 0500) Pulse Rate: 76 (05/24 0500) Respirations: 17

## 2016-02-01 NOTE — Care Management Important Message (Signed)
Important Message  Patient Details  Name: RICHRD KUZNIAR MRN: [id number] Date of Birth: 1941-03-06   Medicare Important Message Given:  Yes    Malcolm Metro, RN 02/01/2016, 11:40 AM

## 2016-02-01 NOTE — Evaluation (Signed)
Physical Therapy Evaluation Patient Details Name: Patrick Tate MRN: [id number] DOB: 1941/08/05 Today's Date: 02/01/2016   History of Present Illness  75 yo M admitted with substernal CP and cough for the past week, along with frequent falls, and R knee tenderness and erythema over B LE's.  Dx: Sepsis, PNA, R LE cellulitis, and stage 1 sacral ulcer.  PMH: DM, HTN, R BKA, L AKA, shoulder surgery, RA, BPH.  Clinical Impression  Pt received in bed, wife present, and pt is agreeable to PT evaluation.  Pt has been w/c bound for ~5years, but requires assistance for transfers into manual w/c.  Pt also requires assistance for dressing and bathing.  Pt performs forward transfer into his w/c, using his R residual limb to "hook and leverage" him into the w/c.  Pt also presents with a stage 1 sacral ulcer, but does not use a w/c cushion because he states that he is not able to get his L residual limb up into the chair if there is a cushion on it. During today's evaluation, he required Min A for transfer supine<>sit.  Wife reports this has been increasingly difficult, therefore, options given to pt and wife to ease transfer.  Pt not willing to demonstrate transfer bed<>chair.  PT requested wife to bring pt's w/c up to the room for tomorrow's tx.  May need to adjust pt's transfer technique to decrease pain with R LE cellulitis, and offer alternatives to reduce risk of injury.  Pt has also had multiple falls when going over uneven terrain in his w/c.  Discussed techniques with wife on how to perform floor transfers safely.  At this point, recommend HHPT, as well as w/c cushion to prevent further skin break down on sacral area.      Follow Up Recommendations Home health PT    Equipment Recommendations       Recommendations for Other Services       Precautions / Restrictions Precautions Precautions: Fall Precaution Comments: Pt and wife stated that pt has had multiple falls.  Pt states they happen when he is in  his w/c and goes over the edge of a sidewalk or uneven terrain, pt has also slid off the sofa. Restrictions Weight Bearing Restrictions: No      Mobility  Bed Mobility Overal bed mobility: Needs Assistance Bed Mobility: Supine to Sit     Supine to sit: Min assist;HOB elevated     General bed mobility comments: HOB significantly elevated.  Pt comes into long sitting position, and then rotates right to the EOB.  Pt requires increased time.  Wife states that it takes him awhile to get up OOB at home.  Suggested tieing something to their footboard and using it to assist with pulling himself up in the bed. Pt refused to attempt to transfer from the bed<>recliner.  Encouraged wife to have pt's w/c brought up to the room for t/f training tomorrow.   Transfers                    Ambulation/Gait                Stairs            Wheelchair Mobility    Modified Rankin (Stroke Patients Only)       Balance Overall balance assessment: Needs assistance Sitting-balance support: Bilateral upper extremity supported Sitting balance-Leahy Scale: Good Sitting balance - Comments: Pt was able to sit on the EOB for ~10 min with supervision  Pertinent Vitals/Pain Pain Assessment: Faces Faces Pain Scale: Hurts little more Pain Location: R knee  Pain Descriptors / Indicators: Sore Pain Intervention(s): Limited activity within patient's tolerance    Home Living Family/patient expects to be discharged to:: Private residence Living Arrangements: Spouse/significant other Available Help at Discharge: Family Type of Home: House Home Access: Ramped entrance     Home Layout: One level Home Equipment: Wheelchair - power;Wheelchair - manual;Shower seat;Bedside commode      Prior Function Level of Independence: Needs assistance   Gait / Transfers Assistance Needed: Pt has mobilized via manual or motorized w/c for the last 5  years.  Wife states that she has to hold the w/c when he transfers into it because he forgets to lock the breaks.  Pt states he transfers into the w/c going fwd.  He does not use a cushion because then he is unable to get his L residual limb up onto the seat.  He then uses his R LE to leverage himself into the w/c.  ADL's / Homemaking Assistance Needed: Wife performs all daily household activities.  Wife assists the pt with both dressing and bathing.         Hand Dominance        Extremity/Trunk Assessment   Upper Extremity Assessment: RUE deficits/detail;LUE deficits/detail RUE Deficits / Details: Pt demonstrates significant UE weakness with shoulder flexion 2/5, elbow flexion 3-/5, elbow extension: 3/5.  Pt's strength and AROM is greatly limited due to pain.  PROM shoulder flexion to ~100*     LUE Deficits / Details: Pt demonstrates significant UE weakness with shoulder flexion 2/5, elbow flexion 3-/5, elbow extension: 3/5.  Pt's strength and AROM is greatly limited due to pain.  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  Hip flexion, abduction, and adduction all 3/5 LLE Deficits / Details: Grossly 3/5     Communication   Communication: HOH  Cognition Arousal/Alertness: Awake/alert Behavior During Therapy: Flat affect Overall Cognitive Status: Within Functional Limits for tasks assessed                      General Comments General comments (skin integrity, edema, etc.): Sacral wound with mepilex bandage, R knee with erythema, B hand edema    Exercises        Assessment/Plan    PT Assessment Patient needs continued PT services  PT Diagnosis Generalized weakness   PT Problem  List Decreased strength;Decreased activity tolerance;Decreased mobility;Decreased safety awareness;Cardiopulmonary status limiting activity;Obesity;Decreased skin integrity  PT Treatment Interventions DME instruction;Functional mobility training;Therapeutic activities;Therapeutic exercise;Balance training;Patient/family education;Wheelchair mobility training   PT Goals (Current goals can be found in the Care Plan section) Acute Rehab PT Goals Patient Stated Goal: Pt wants to go home.  PT Goal Formulation: With patient/family Time For Goal Achievement: 02/08/16 Potential to Achieve Goals: Fair    Frequency Min 3X/week   Barriers to discharge        Co-evaluation               End of Session   Activity Tolerance: Patient tolerated treatment well Patient left: in bed      Functional Assessment Tool Used: Clinical Judgement Functional Limitation: Changing and maintaining  body position Changing and Maintaining Body Position Current Status [phone]): At least 40 percent but less than 60 percent impaired, limited or restricted Changing and Maintaining Body Position Goal Status (L8756): At least 20 percent but less than 40 percent impaired, limited or restricted    Time: 1458-1526 PT Time Calculation (min) (ACUTE ONLY): 28 min   Charges:   PT Evaluation $PT Eval Moderate Complexity: 1 Procedure PT Treatments $Self Care/Home Management: 8-22   PT G Codes:   PT G-Codes **NOT FOR INPATIENT CLASS** Functional Assessment Tool Used: Clinical Judgement Functional Limitation: Changing and maintaining body position Changing and Maintaining Body Position Current Status (E3329): At least 40 percent but less than 60 percent impaired, limited or restricted Changing and Maintaining Body Position Goal Status (J1884): At least 20 percent but less than 40 percent impaired, limited or restricted    Carollee Herter, PT, DPT X: [id number]   02/01/2016, 7:39 PM

## 2016-02-01 NOTE — Progress Notes (Signed)
.   PHARMACY - PHYSICIAN COMMUNICATION CRITICAL VALUE ALERT - BLOOD CULTURE IDENTIFICATION (BCID)  Results for orders placed or performed during the hospital encounter of 01/30/16  Blood Culture ID Panel (Reflexed) (Collected: 01/30/2016  9:30 AM)  Result Value Ref Range   Enterococcus species NOT DETECTED NOT DETECTED   Vancomycin resistance NOT DETECTED NOT DETECTED   Listeria monocytogenes NOT DETECTED NOT DETECTED   Staphylococcus species NOT DETECTED NOT DETECTED   Staphylococcus aureus NOT DETECTED NOT DETECTED   Methicillin resistance NOT DETECTED NOT DETECTED   Streptococcus species NOT DETECTED NOT DETECTED   Streptococcus agalactiae NOT DETECTED NOT DETECTED   Streptococcus pneumoniae NOT DETECTED NOT DETECTED   Streptococcus pyogenes NOT DETECTED NOT DETECTED   Acinetobacter baumannii NOT DETECTED NOT DETECTED   Enterobacteriaceae species NOT DETECTED NOT DETECTED   Enterobacter cloacae complex NOT DETECTED NOT DETECTED   Escherichia coli NOT DETECTED NOT DETECTED   Klebsiella oxytoca NOT DETECTED NOT DETECTED   Klebsiella pneumoniae NOT DETECTED NOT DETECTED   Proteus species NOT DETECTED NOT DETECTED   Serratia marcescens NOT DETECTED NOT DETECTED   Carbapenem resistance NOT DETECTED NOT DETECTED   Haemophilus influenzae NOT DETECTED NOT DETECTED   Neisseria meningitidis NOT DETECTED NOT DETECTED   Pseudomonas aeruginosa NOT DETECTED NOT DETECTED   Candida albicans NOT DETECTED NOT DETECTED   Candida glabrata NOT DETECTED NOT DETECTED   Candida krusei NOT DETECTED NOT DETECTED   Candida parapsilosis NOT DETECTED NOT DETECTED   Candida tropicalis NOT DETECTED NOT DETECTED   Culture, Blood GRAM POSITIVE COCCI  CORRECTED RESULTS GRAM NEGATIVE COCCOBACILLI  CORRECTED RESULTS CALLED TO: LDutch Quint, PHARM AT 1230 ON [id number] BY Lucienne Capers  Performed at [facility]           Name of physician (or Provider) Contacted: Dr. Sherrie Mustache 76 yo male admitted with CAP and  started on 5/22 zithromax and ceftriaxone. Blood cultures 1 of 2 on 5/23 turned +GPC and called to MD, hence Vancomycin initiated.  5/24 Microbiology called me with updated results of blood cx and it was changed and identified as Gram negative coccobacilli. Results discussed with Dr. Sherrie Mustache and the Vancomycin will be discontinued.   Changes to prescribed antibiotics required: discontinue Vancomycin  Elder Cyphers, BS Loura Back, [place] Clinical Pharmacist Pager [phone]  02/01/2016  4:02 PM

## 2016-02-02 ENCOUNTER — Encounter (HOSPITAL_COMMUNITY): Payer: Self-pay | Admitting: Internal Medicine

## 2016-02-02 ENCOUNTER — Inpatient Hospital Stay (HOSPITAL_COMMUNITY): Payer: Medicare Other

## 2016-02-02 DIAGNOSIS — N183 Chronic kidney disease, stage 3 (moderate): Secondary | ICD-10-CM

## 2016-02-02 DIAGNOSIS — I10 Essential (primary) hypertension: Secondary | ICD-10-CM

## 2016-02-02 DIAGNOSIS — I350 Nonrheumatic aortic (valve) stenosis: Secondary | ICD-10-CM

## 2016-02-02 DIAGNOSIS — E8770 Fluid overload, unspecified: Secondary | ICD-10-CM

## 2016-02-02 DIAGNOSIS — J9601 Acute respiratory failure with hypoxia: Secondary | ICD-10-CM

## 2016-02-02 HISTORY — DX: Nonrheumatic aortic (valve) stenosis: I35.0

## 2016-02-02 LAB — BASIC METABOLIC PANEL
Anion gap: 7 (ref 5–15)
BUN: 39 mg/dL — ABNORMAL HIGH (ref 6–20)
CALCIUM: 7.9 mg/dL — AB (ref 8.9–10.3)
CO2: 27 mmol/L (ref 22–32)
CREATININE: 1.9 mg/dL — AB (ref 0.61–1.24)
Chloride: 104 mmol/L (ref 101–111)
GFR calc Af Amer: 39 mL/min — ABNORMAL LOW (ref >60)
GFR, EST NON AFRICAN AMERICAN: 33 mL/min — AB (ref >60)
Glucose, Bld: 90 mg/dL (ref 65–99)
Potassium: 2.9 mmol/L — ABNORMAL LOW (ref 3.5–5.1)
Sodium: 138 mmol/L (ref 135–145)

## 2016-02-02 LAB — CBC
HEMATOCRIT: 27.7 % — AB (ref 39.0–52.0)
HEMOGLOBIN: 8.5 g/dL — AB (ref 13.0–17.0)
MCH: 24.5 pg — AB (ref 26.0–34.0)
MCHC: 30.7 g/dL (ref 30.0–36.0)
MCV: 79.8 fL (ref 78.0–100.0)
Platelets: 292 10*3/uL (ref 150–400)
RBC: 3.47 MIL/uL — ABNORMAL LOW (ref 4.22–5.81)
RDW: 16.7 % — ABNORMAL HIGH (ref 11.5–15.5)
WBC: 15 10*3/uL — ABNORMAL HIGH (ref 4.0–10.5)

## 2016-02-02 LAB — CULTURE, BLOOD (ROUTINE X 2)

## 2016-02-02 LAB — GLUCOSE, CAPILLARY
GLUCOSE-CAPILLARY: 84 mg/dL (ref 65–99)
GLUCOSE-CAPILLARY: 149 mg/dL — AB (ref 65–99)
Glucose-Capillary: 101 mg/dL — ABNORMAL HIGH (ref 65–99)
Glucose-Capillary: 171 mg/dL — ABNORMAL HIGH (ref 65–99)

## 2016-02-02 LAB — ECHOCARDIOGRAM COMPLETE
Height: 60 in
Weight: 3365.1 oz

## 2016-02-02 LAB — IRON AND TIBC
IRON: 7 ug/dL — AB (ref 45–182)
Saturation Ratios: 3 % — ABNORMAL LOW (ref 17.9–39.5)
TIBC: 206 ug/dL — ABNORMAL LOW (ref 250–450)
UIBC: 199 ug/dL

## 2016-02-02 LAB — OCCULT BLOOD X 1 CARD TO LAB, STOOL: Fecal Occult Bld: NEGATIVE

## 2016-02-02 LAB — FERRITIN: FERRITIN: 148 ng/mL (ref 24–336)

## 2016-02-02 LAB — VITAMIN B12: Vitamin B-12: 834 pg/mL (ref 180–914)

## 2016-02-02 MED ORDER — POTASSIUM CHLORIDE CRYS ER 20 MEQ PO TBCR
40.0000 meq | EXTENDED_RELEASE_TABLET | Freq: Two times a day (BID) | ORAL | Status: AC
  Administered 2016-02-02 (×2): 40 meq via ORAL
  Filled 2016-02-02 (×2): qty 2

## 2016-02-02 MED ORDER — PIPERACILLIN-TAZOBACTAM 3.375 G IVPB
3.3750 g | Freq: Three times a day (TID) | INTRAVENOUS | Status: DC
  Administered 2016-02-02 – 2016-02-04 (×7): 3.375 g via INTRAVENOUS
  Filled 2016-02-02 (×7): qty 50

## 2016-02-02 MED ORDER — INSULIN GLARGINE 100 UNIT/ML ~~LOC~~ SOLN
20.0000 [IU] | Freq: Every day | SUBCUTANEOUS | Status: DC
  Administered 2016-02-02 – 2016-02-03 (×2): 20 [IU] via SUBCUTANEOUS
  Filled 2016-02-02 (×4): qty 0.2

## 2016-02-02 MED ORDER — DIPHENHYDRAMINE HCL 25 MG PO CAPS
25.0000 mg | ORAL_CAPSULE | Freq: Once | ORAL | Status: AC
  Administered 2016-02-02: 25 mg via ORAL
  Filled 2016-02-02: qty 1

## 2016-02-02 MED ORDER — ENOXAPARIN SODIUM 40 MG/0.4ML ~~LOC~~ SOLN
40.0000 mg | SUBCUTANEOUS | Status: DC
  Administered 2016-02-02 – 2016-02-03 (×2): 40 mg via SUBCUTANEOUS
  Filled 2016-02-02 (×2): qty 0.4

## 2016-02-02 MED ORDER — PREDNISONE 20 MG PO TABS
40.0000 mg | ORAL_TABLET | Freq: Once | ORAL | Status: DC
  Filled 2016-02-02: qty 2

## 2016-02-02 MED ORDER — DILTIAZEM HCL 30 MG PO TABS
30.0000 mg | ORAL_TABLET | Freq: Two times a day (BID) | ORAL | Status: DC
  Administered 2016-02-02 – 2016-02-03 (×3): 30 mg via ORAL
  Filled 2016-02-02 (×3): qty 1

## 2016-02-02 MED ORDER — FUROSEMIDE 10 MG/ML IJ SOLN
20.0000 mg | Freq: Once | INTRAMUSCULAR | Status: AC
  Administered 2016-02-02: 20 mg via INTRAVENOUS
  Filled 2016-02-02: qty 2

## 2016-02-02 NOTE — Progress Notes (Signed)
Inpatient Diabetes Program Recommendations  AACE/ADA: New Consensus Statement on Inpatient Glycemic Control (2015)  Target Ranges:  Prepandial:   less than 140 mg/dL      Peak postprandial:   less than 180 mg/dL (1-2 hours)      Critically ill patients:  140 - 180 mg/dL   Results for CARTEZ, MOGLE (MRN [id number]) as of 02/02/2016 08:23  Ref. Range 02/01/2016 07:54 02/01/2016 11:01 02/01/2016 16:36 02/01/2016 20:39 02/02/2016 07:43  Glucose-Capillary Latest Ref Range: 65-99 mg/dL 962 (H) 836 (H) 629 (H) 270 (H) 84   Review of Glycemic Control  Diabetes history: DM 2 Outpatient Diabetes medications: Lantus 30 units, Humalog 10-20 units TID Current orders for Inpatient glycemic control: Lantus 35 units, Novolog Moderate + HS scale  Inpatient Diabetes Program Recommendations:  Insulin - Basal: Fasting glucose 84 mg/dl. Patient is on Lantus 35 units here. Patient takes 30 units at home. If Solucortef is reduced anymore may want to decrease basal insulin to home dose, Lantus 30 units. Patient takes between 10-20 units at meal times. Please consider adding Novolog 3 units TID meal coverage if consuming at least 50% of meals.  Thanks,  Christena Deem RN, MSN, [facility] Inpatient Diabetes Coordinator Team Pager [phone] (8a-5p)

## 2016-02-02 NOTE — Progress Notes (Signed)
*  PRELIMINARY RESULTS* Echocardiogram 2D Echocardiogram has been performed.  Patrick Tate 02/02/2016, 3:38 PM

## 2016-02-02 NOTE — Progress Notes (Signed)
PROGRESS NOTE    Patrick Tate  RAX:[id number] DOB: 1941/05/13 DOA: 01/30/2016 PCP: Patrick Hector, MD   Brief Narrative:  75 -year-old man with a history of diabetes mellitus, bilateral lower extremity amputee, steroid dependent rheumatoid arthritis, and hypertension, who presented on 01/30/2016 for generalized weakness and chest pain. In the ED, his temperature was 101.3. His blood pressure was on the lower end of normal and he was mildly tachycardic. He was oxygenating 88% on room air. His lab data were significant for an elevated lactic acid level, creatinine of 230, W BC of 13.5, hemoglobin of 10.9. His chest x-ray revealed bibasilar airspace disease left greater than right, suspicious for pneumonia. He was admitted for further evaluation and management.   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 Sepsis with gram-negative bacteremia, secondary to RLE cellulitis and CAP On admission , the patient was febrile with a temperature of 101.3, relatively hypotensive, and tachycardic. His lactic acid was elevated and his white blood cell count was 13.5.  CXR revealed left greater than right airspace disease consistent with pneumonia. His  UA  was essentially negative. He was started on IV Rocephin and azithromycin. Vigorous IV fluids were given. Stress dose hydrocortisone was started due to his chronic prednisone therapy for RA. -Strep pneumo antigen was  negative. -Blood cultures revealed one out of 2 becoming positive for gram negative coccobacilli identified as Acinetobacter (previously erroneously identified as gram-positive cocci).  -Sepsis parameters have improved. Leukocytosis is persisting, possibly secondary to IV steroid versus bacteremia. -Discussed with pharmacist, Patrick Tate. To cover Acinetobacter bacteremia, pneumonia, and cellulitis, antibiotic therapy will be changed to Zosyn alone. -We'll recheck another set of blood cultures in the next day or 2.  Community-acquired pneumonia. -Treatment started as above in #1. Patient has a minimal nonproductive cough. He appears to be oxygenating in the mid 90s on room air. -Follow-up chest x-ray ordered for evaluation.  Acute respiratory failure with hypoxia, secondary to CAP. Patient's oxygen saturation dipped down to 88% on room air in the ED. Supplemental oxygen was applied. His oxygen saturations have improved.  Right lower extremity cellulitis. Patient was started on vancomycin for treatment. His erythema appears to be improving. Vancomycin was discontinued in favor of Zosyn to cover all 3 infections.  Volume overload. Patient has bilateral upper extremity edema and bilateral stump edema. The etiology is likely from vigorous IV fluid hydration initially for treatment of sepsis. -We'll give 1 dose of IV Lasix and monitor. Will order a follow-up chest x-ray. Will order a BNP. Will order 2-D echocardiogram.  Insulin-dependent diabetes mellitus. Patient is treated with Lantus and sliding scale Humalog at home. His blood sugars have increased on IV hydrocortisone. Dose of Lantus was increased on 01/31/16. -IV hydrocortisone was decreased which decreased his CBGs precipitously. Will decrease Lantus dosing to avoid symptomatic hypoglycemia. -Patient's A1c was 8.3.  Acute kidney injury superimposed on stage III chronic kidney disease. Patient's creatinine was 2.3  on admission. 11 months ago was  1.72 and one year ago it was 2.18. Patient was vigorously hydrated for the first 24 hours. His creatinine has improved to 1.9. -Etiology may be ATN due to sepsis. We'll continue to monitor closely.  Normocytic anemia. Patient's hemoglobin was 9.9 on admission. He probably has anemia of chronic disease from rheumatoid arthritis. It has drifted down to 8.5. -His stool was Hemoccult negative 1. Ferritin, vitamin B12, and total iron was ordered and are pending.  Rheumatoid arthritis. Patient is treated with chronic prednisone. He was started on stress dose IV hydrocortisone. - IV hydrocortisone was titrated down and will eventually taper back to home dose of prednisone.  Hypertension. Patient is treated chronically with amlodipine and diltiazem. They were both placed on hold on admission due to patient's systolic blood pressure being in the 90s. His blood pressure has improved.  -We'll restart diltiazem at a lower dose.  Hypokalemia. Patient's serum potassium drifted down from normal to 2.9. -We'll start potassium chloride supplementation.  Right upper extremity edema. Venous Doppler ultrasound was negative for DVT. Etiology may be secondary to the blood pressure cuff impeding the venous flow. RN notified and a larger cuff was requested; elevate right arm.  BPH. He was continued on finasteride.  Anxiety. He was continued on Cymbalta and Xanax.  Stage I sacral ulcer. Barrier management per nursing.       DVT prophylaxis: Lovenox  Code Status: DNR Family Communication: Discussed with patient's wife. Disposition Plan: Discharge when clinically appropriate. He may need short-term SNF.   Consultants:   None  Procedures:  None  Antimicrobials:    Zosyn 02/02/16>>  Zithromax 5/22>> 02/02/16  Vancomycin 5/22>> 02/02/16  Rocephin 5/22>> 02/02/16   Subjective: Patient denies chest pain or shortness of breath at rest. He complains of not sleeping last night due to noise in  the room and outside of the room.  Objective: Filed Vitals:   02/01/16 2147 02/02/16 0607 02/02/16 0757 02/02/16 1000  BP:  118/68    Pulse: 63 88  120  Temp: 97.8 F (36.6 C) 98.6 F (37 C)    TempSrc: Oral Oral    Resp: 20 20    Height:      Weight:  95.4 kg (210 lb 5.1 oz)    SpO2: 100% 100% 90% 94%    Intake/Output Summary (Last 24 hours) at 02/02/16 1314 Last data filed at 02/02/16 0926  Gross per 24 hour  Intake    780 ml  Output   1450 ml  Net   -670 ml   Filed Weights   01/31/16 0500 02/01/16 0500 02/02/16 0607  Weight: 97 kg (213 lb 13.5 oz) 95.5 kg (210 lb 8.6 oz) 95.4 kg (210 lb 5.1 oz)    Examination:  General exam: Appears calm and comfortable  Respiratory system: Few crackles at the bases; breathing nonlabored. Cardiovascular system: S1 & S2 heard with a 2/6 systolic murmur. Gastrointestinal system: Abdomen is mildly obese nondistended, soft and nontender.  Normal bowel sounds heard. Central nervous system: Alert and oriented. No focal neurological deficits. Extremities: right lower extremity stump with scant erythema and trace to 1+ lower extremity edema. Left stump with trace of edema. Right and left upper extremities with 1-2 + pitting edema. Skin: Mild erythema over right lower extremity stump Psychiatry:  Mood & affect appropriate.     Data Reviewed: I have personally reviewed following labs and imaging studies  CBC:  Recent Labs Lab 01/30/16 0920 01/30/16 2039 01/31/16  4163 02/01/16 0351 02/02/16 0622  WBC 13.5* 11.1* 14.4* 13.4* 15.0*  NEUTROABS 11.7*  --   --   --   --   HGB 10.9* 9.9* 10.3* 8.7* 8.5*  HCT 36.9* 33.1* 34.5* 28.6* 27.7*  MCV 83.1 82.5 83.3 82.2 79.8  PLT 367 225 265 268 292   Basic Metabolic Panel:  Recent Labs Lab 01/30/16 0920 01/30/16 2039 02/01/16 0351 02/02/16 0622  NA 139 137 137 138  K 3.6 4.2 3.7 2.9*  CL 102 109 103 104  CO2 26 22 26 27   GLUCOSE 211* 162* 206* 90  BUN 29* 29* 38* 39*  CREATININE  2.30* 2.29* 2.39* 1.90*  CALCIUM 9.7 8.2* 8.0* 7.9*   GFR: Estimated Creatinine Clearance: 33.4 mL/min (by C-G formula based on Cr of 1.9). Liver Function Tests:  Recent Labs Lab 01/30/16 0920 01/30/16 2039  AST 17 16  ALT 18 16*  ALKPHOS 96 70  BILITOT 0.6 0.6  PROT 6.4* 5.0*  ALBUMIN 2.9* 2.3*   No results for input(s): LIPASE, AMYLASE in the last 168 hours. No results for input(s): AMMONIA in the last 168 hours. Coagulation Profile: No results for input(s): INR, PROTIME in the last 168 hours. Cardiac Enzymes: No results for input(s): CKTOTAL, CKMB, CKMBINDEX, TROPONINI in the last 168 hours. BNP (last 3 results) No results for input(s): PROBNP in the last 8760 hours. HbA1C: No results for input(s): HGBA1C in the last 72 hours. CBG:  Recent Labs Lab 02/01/16 1101 02/01/16 1636 02/01/16 2039 02/02/16 0743 02/02/16 1153  GLUCAP 253* 222* 270* 84 101*   Lipid Profile: No results for input(s): CHOL, HDL, LDLCALC, TRIG, CHOLHDL, LDLDIRECT in the last 72 hours. Thyroid Function Tests: No results for input(s): TSH, T4TOTAL, FREET4, T3FREE, THYROIDAB in the last 72 hours. Anemia Panel: No results for input(s): VITAMINB12, FOLATE, FERRITIN, TIBC, IRON, RETICCTPCT in the last 72 hours. Urine analysis:    Component Value Date/Time   COLORURINE YELLOW 01/31/2016 0728   APPEARANCEUR CLEAR 01/31/2016 0728   LABSPEC <1.005* 01/31/2016 0728   PHURINE 5.0 01/31/2016 0728   GLUCOSEU NEGATIVE 01/31/2016 0728   HGBUR TRACE* 01/31/2016 0728   BILIRUBINUR NEGATIVE 01/31/2016 0728   KETONESUR NEGATIVE 01/31/2016 0728   PROTEINUR NEGATIVE 01/31/2016 0728   UROBILINOGEN 0.2 07/10/2007 1447   NITRITE NEGATIVE 01/31/2016 0728   LEUKOCYTESUR NEGATIVE 01/31/2016 0728   Sepsis Labs: @LABRCNTIP (procalcitonin:4,lacticidven:4)  ) Recent Results (from the past 240 hour(s))  Culture, blood (Routine x 2)     Status: None (Preliminary result)   Collection Time: 01/30/16  9:20 AM   Result Value Ref Range Status   Specimen Description BLOOD LEFT ANTECUBITAL DRAWN BY RN LR  Final   Special Requests BOTTLES DRAWN AEROBIC AND ANAEROBIC 6CC EACH  Final   Culture NO GROWTH 3 DAYS  Final   Report Status PENDING  Incomplete  Culture, blood (Routine x 2)     Status: Abnormal   Collection Time: 01/30/16  9:30 AM  Result Value Ref Range Status   Specimen Description BLOOD RIGHT ANTECUBITAL  Final   Special Requests BOTTLES DRAWN AEROBIC AND ANAEROBIC 6CC EACH  Final   Culture  Setup Time   Final    GRAM POSITIVE COCCI ANAEROBIC BOTTLE ONLY Gram Stain Report Called to,Read Back By and Verified With: MORRIS,C ON 01/31/16 @0840  BY MITCHELL,S    Culture (A)  Final    ACINETOBACTER SPECIES CORRECTED RESULTS GRAM NEGATIVE COCCOBACILLI CORRECTED RESULTS CALLED TO: L. POOLE, PHARM AT 1230 ON [id number] BY S.  YARBROUGH Performed at [facility]    Report Status 02/02/2016 FINAL  Final   Organism ID, Bacteria ACINETOBACTER SPECIES  Final      Susceptibility   Acinetobacter species - MIC*    CEFTAZIDIME 4 SENSITIVE Sensitive     CEFTRIAXONE 16 INTERMEDIATE Intermediate     CIPROFLOXACIN <=0.25 SENSITIVE Sensitive     GENTAMICIN <=1 SENSITIVE Sensitive     IMIPENEM <=0.25 SENSITIVE Sensitive     PIP/TAZO <=4 SENSITIVE Sensitive     TRIMETH/SULFA <=20 SENSITIVE Sensitive     AMPICILLIN/SULBACTAM <=2 SENSITIVE Sensitive     * ACINETOBACTER SPECIES  Blood Culture ID Panel (Reflexed)     Status: None   Collection Time: 01/30/16  9:30 AM  Result Value Ref Range Status   Enterococcus species NOT DETECTED NOT DETECTED Final   Vancomycin resistance NOT DETECTED NOT DETECTED Final   Listeria monocytogenes NOT DETECTED NOT DETECTED Final   Staphylococcus species NOT DETECTED NOT DETECTED Final   Staphylococcus aureus NOT DETECTED NOT DETECTED Final   Methicillin resistance NOT DETECTED NOT DETECTED Final   Streptococcus species NOT DETECTED NOT DETECTED Final   Streptococcus  agalactiae NOT DETECTED NOT DETECTED Final   Streptococcus pneumoniae NOT DETECTED NOT DETECTED Final   Streptococcus pyogenes NOT DETECTED NOT DETECTED Final   Acinetobacter baumannii NOT DETECTED NOT DETECTED Final   Enterobacteriaceae species NOT DETECTED NOT DETECTED Final   Enterobacter cloacae complex NOT DETECTED NOT DETECTED Final   Escherichia coli NOT DETECTED NOT DETECTED Final   Klebsiella oxytoca NOT DETECTED NOT DETECTED Final   Klebsiella pneumoniae NOT DETECTED NOT DETECTED Final   Proteus species NOT DETECTED NOT DETECTED Final   Serratia marcescens NOT DETECTED NOT DETECTED Final   Carbapenem resistance NOT DETECTED NOT DETECTED Final   Haemophilus influenzae NOT DETECTED NOT DETECTED Final   Neisseria meningitidis NOT DETECTED NOT DETECTED Final   Pseudomonas aeruginosa NOT DETECTED NOT DETECTED Final   Candida albicans NOT DETECTED NOT DETECTED Final   Candida glabrata NOT DETECTED NOT DETECTED Final   Candida krusei NOT DETECTED NOT DETECTED Final   Candida parapsilosis NOT DETECTED NOT DETECTED Final   Candida tropicalis NOT DETECTED NOT DETECTED Final    Comment: Performed at [facility]  MRSA PCR Screening     Status: None   Collection Time: 01/30/16  1:15 PM  Result Value Ref Range Status   MRSA by PCR NEGATIVE NEGATIVE Final    Comment:        The GeneXpert MRSA Assay (FDA approved for NASAL specimens only), is one component of a comprehensive MRSA colonization surveillance program. It is not intended to diagnose MRSA infection nor to guide or monitor treatment for MRSA infections.   Urine culture     Status: None   Collection Time: 01/30/16  9:40 PM  Result Value Ref Range Status   Specimen Description URINE, CATHETERIZED  Final   Special Requests NONE  Final   Culture NO GROWTH Performed at [facility]   Final   Report Status 02/01/2016 FINAL  Final         Radiology Studies: No results found.      Scheduled  Meds: . ALPRAZolam  0.5 mg Oral QHS  . antiseptic oral rinse  7 mL Mouth Rinse BID  . aspirin EC  81 mg Oral Daily  . atorvastatin  20 mg Oral q1800  . azithromycin  500 mg Intravenous Q24H  . cefTRIAXone (ROCEPHIN)  IV  1 g Intravenous  Q24H  . DULoxetine  30 mg Oral Daily  . enoxaparin (LOVENOX) injection  30 mg Subcutaneous Q24H  . finasteride  5 mg Oral Daily  . guaiFENesin  1,200 mg Oral BID  . hydrocortisone sod succinate (SOLU-CORTEF) inj  25 mg Intravenous Q12H  . insulin aspart  0-15 Units Subcutaneous TID WC  . insulin aspart  0-5 Units Subcutaneous QHS  . insulin glargine  35 Units Subcutaneous QHS  . ipratropium-albuterol  3 mL Nebulization TID  . predniSONE  40 mg Oral Once   Continuous Infusions:    LOS: 3 days    Time spent: 35 mins    Joanette Gula, M.D. [facility] Pager [phone]  If 7PM-7AM, please contact night-coverage [email or web address] Password [email or web address] 02/02/2016, 1:14 PM

## 2016-02-02 NOTE — Progress Notes (Signed)
Physical Therapy Treatment Patient Details Name: Patrick Tate MRN: [id number] DOB: 1941-02-17 Today's Date: 02/02/2016    History of Present Illness 75 yo M admitted with substernal CP and cough for the past week, along with frequent falls, and R knee tenderness and erythema over B LE's.  Dx: Sepsis, PNA, R LE cellulitis, and stage 1 sacral ulcer.  PMH: DM, HTN, R BKA, L AKA, shoulder surgery, RA, BPH.    PT Comments    Pt received in bed, wife present, and pt was agreeable to PT tx.  Pt was able to perform transfer bed<>w/c with Min guard via fwd scoot, however this type of transfer puts his R residual limb at risk for skin break down, which is already noted in several small places.  Discussed ways to protect skin including placing a kneepad or sock over residual limb to provide a skin barrier.  Pt also fatigues quickly when propelling w/c with HR up to 120bpm after going 81ft.  Pain and arthritis in B shoulders limit pt's ability to propel more. Pt also has a Stage 1 sacral ulcer, and currently does not use a cushion.  Recommend a gel cushion with a cover - foam cushions have been too high in the past and impede his ability to perform transfers.  Continue to recommend HHPT to ensure that he continues to mobilize safely at home due to multiple frequent falls.   Follow Up Recommendations  Home health PT     Equipment Recommendations  Wheelchair cushion (measurements PT) (Pt has stage 1 sacral pressure ulcer - may benefit from gel cushion.  Foam is too high and does not allow him to transfer into his w/c. )    Recommendations for Other Services       Precautions / Restrictions Precautions Precautions: Fall Precaution Comments: Pt and wife stated that pt has had multiple falls.  Pt states they happen when he is in his w/c and goes over the edge of a sidewalk or uneven terrain, pt has also slid off the sofa. Restrictions Weight Bearing Restrictions: No    Mobility  Bed Mobility Overal  bed mobility: Needs Assistance Bed Mobility: Supine to Sit     Supine to sit: Min assist;HOB elevated     General bed mobility comments: Pt states that his wife assists him with bed mobility.   Transfers Overall transfer level: Needs assistance Equipment used: None Transfers:  (Fwd scoot transfer)           General transfer comment: W/C brought up to the EOB facing the pt.  Pt then uses the back of the w/c and pulls himself partially up on to residual limbs and scoots fwd.  Once his buttocks is completely in the w/c, but facing backwards, he uses the arm rests to raise up, and pivot around to the right.  He also uses the R residual limb to hook on the edge of the chair and assist to lift up and pivot around.  Discussed safety measures with the transfer including addint a knee pad to the R knee, and socks to provide a skin barrier while he is performing transfers.  Pt states he cannot go backwards into the chair because of his shoulders, and unable to do that from his lift  chair.  Unable to use sliding board because he gets too frustrated, and wife states the chair tips on him.  Ambulation/Gait                 Stairs  [employer] officer mobility: Yes Wheelchair propulsion: Both upper extremities Wheelchair parts: Needs assistance (R brake is broken in 2 pieces, and does not lock) Distance: Pt able to propel 58ft with supervision due to IV.  Pt with elevated HR afterwards at 120bpm  Modified Rankin (Stroke Patients Only)       Balance   Sitting-balance support: Bilateral upper extremity supported Sitting balance-Leahy Scale: Good                              Cognition Arousal/Alertness: Awake/alert Behavior During Therapy: Flat affect Overall Cognitive Status: History of cognitive impairments - at baseline (Wife states that he is forgetful at home. )                      Exercises      General  Comments        Pertinent Vitals/Pain Pain Assessment: No/denies pain    Home Living                      Prior Function            PT Goals (current goals can now be found in the care plan section) Acute Rehab PT Goals Patient Stated Goal: Pt wants to go home.  PT Goal Formulation: With patient/family Time For Goal Achievement: 02/08/16 Potential to Achieve Goals: Fair Progress towards PT goals: Progressing toward goals    Frequency  Min 3X/week    PT Plan Current plan remains appropriate    Co-evaluation             End of Session   Activity Tolerance: Patient tolerated treatment well Patient left: with call bell/phone within reach;with family/visitor present (in wheelchair. )     Time: 0933-1000 PT Time Calculation (min) (ACUTE ONLY): 27 min  Charges:  $Therapeutic Activity: 8-22 mins $Wheel Chair Management: 8-22 mins                    G Codes:      Beth Chiann Goffredo, PT, DPT X: [phone]   02/02/2016, 12:06 PM

## 2016-02-02 NOTE — Progress Notes (Signed)
Pharmacy Antibiotic Note  Patrick Tate is a 75 y.o. male admitted on 01/30/2016 with cellulitis./ bacteremia >>  BCID cultures noted.  D/W Dr Sherrie Mustache.  Also concern for CAP.  Pharmacy has been consulted for ZOSYN dosing.  WBC continues to rise.  Cultures results noted.    Plan:  ZOSYN 3.375gm IV q8h, EID (for clcr > 30)  Monitor labs, progress, c/s  Height: 5' (152.4 cm) Weight: 210 lb 5.1 oz (95.4 kg) IBW/kg (Calculated) : 50  Temp (24hrs), Avg:98.4 F (36.9 C), Min:97.8 F (36.6 C), Max:98.7 F (37.1 C)   Recent Labs Lab 01/30/16 0920 01/30/16 0921 01/30/16 1229 01/30/16 2039 01/31/16 0837 02/01/16 0351 02/02/16 0622  WBC 13.5*  --   --  11.1* 14.4* 13.4* 15.0*  CREATININE 2.30*  --   --  2.29*  --  2.39* 1.90*  LATICACIDVEN  --  2.43* 1.31  --   --   --   --     Estimated Creatinine Clearance: 33.4 mL/min (by C-G formula based on Cr of 1.9).    No Known Allergies  Antimicrobials this admission: 5/22 rocephin >> 5/25 5/22 zithromax >> 5/25 5/22 Vancomycin > 5/24 5/25 ZOSYN >>  Recent Results (from the past 240 hour(s))  Culture, blood (Routine x 2)     Status: None (Preliminary result)   Collection Time: 01/30/16  9:20 AM  Result Value Ref Range Status   Specimen Description BLOOD LEFT ANTECUBITAL DRAWN BY RN LR  Final   Special Requests BOTTLES DRAWN AEROBIC AND ANAEROBIC 6CC EACH  Final   Culture NO GROWTH 3 DAYS  Final   Report Status PENDING  Incomplete  Culture, blood (Routine x 2)     Status: Abnormal   Collection Time: 01/30/16  9:30 AM  Result Value Ref Range Status   Specimen Description BLOOD RIGHT ANTECUBITAL  Final   Special Requests BOTTLES DRAWN AEROBIC AND ANAEROBIC 6CC EACH  Final   Culture  Setup Time   Final    GRAM POSITIVE COCCI ANAEROBIC BOTTLE ONLY Gram Stain Report Called to,Read Back By and Verified With: MORRIS,C ON 01/31/16 @0840  BY MITCHELL,S    Culture (A)  Final    ACINETOBACTER SPECIES CORRECTED RESULTS GRAM NEGATIVE  COCCOBACILLI CORRECTED RESULTS CALLED TO: L , PHARM AT 1230 ON [address] [place] [id number] Performed at [facility]    Report Status 02/02/2016 FINAL  Final   Organism ID, Bacteria ACINETOBACTER SPECIES  Final      Susceptibility   Acinetobacter species - MIC*    CEFTAZIDIME 4 SENSITIVE Sensitive     CEFTRIAXONE 16 INTERMEDIATE Intermediate     CIPROFLOXACIN <=0.25 SENSITIVE Sensitive     GENTAMICIN <=1 SENSITIVE Sensitive     IMIPENEM <=0.25 SENSITIVE Sensitive     PIP/TAZO <=4 SENSITIVE Sensitive     TRIMETH/SULFA <=20 SENSITIVE Sensitive     AMPICILLIN/SULBACTAM <=2 SENSITIVE Sensitive     * ACINETOBACTER SPECIES  Blood Culture ID Panel (Reflexed)     Status: None   Collection Time: 01/30/16  9:30 AM  Result Value Ref Range Status   Enterococcus species NOT DETECTED NOT DETECTED Final   Vancomycin resistance NOT DETECTED NOT DETECTED Final   Listeria monocytogenes NOT DETECTED NOT DETECTED Final   Staphylococcus species NOT DETECTED NOT DETECTED Final   Staphylococcus aureus NOT DETECTED NOT DETECTED Final   Methicillin resistance NOT DETECTED NOT DETECTED Final   Streptococcus species NOT DETECTED NOT DETECTED Final   Streptococcus agalactiae NOT DETECTED NOT DETECTED  Final   Streptococcus pneumoniae NOT DETECTED NOT DETECTED Final   Streptococcus pyogenes NOT DETECTED NOT DETECTED Final   Acinetobacter baumannii NOT DETECTED NOT DETECTED Final   Enterobacteriaceae species NOT DETECTED NOT DETECTED Final   Enterobacter cloacae complex NOT DETECTED NOT DETECTED Final   Escherichia coli NOT DETECTED NOT DETECTED Final   Klebsiella oxytoca NOT DETECTED NOT DETECTED Final   Klebsiella pneumoniae NOT DETECTED NOT DETECTED Final   Proteus species NOT DETECTED NOT DETECTED Final   Serratia marcescens NOT DETECTED NOT DETECTED Final   Carbapenem resistance NOT DETECTED NOT DETECTED Final   Haemophilus influenzae NOT DETECTED NOT DETECTED Final   Neisseria meningitidis NOT  DETECTED NOT DETECTED Final   Pseudomonas aeruginosa NOT DETECTED NOT DETECTED Final   Candida albicans NOT DETECTED NOT DETECTED Final   Candida glabrata NOT DETECTED NOT DETECTED Final   Candida krusei NOT DETECTED NOT DETECTED Final   Candida parapsilosis NOT DETECTED NOT DETECTED Final   Candida tropicalis NOT DETECTED NOT DETECTED Final    Comment: Performed at [facility]  MRSA PCR Screening     Status: None   Collection Time: 01/30/16  1:15 PM  Result Value Ref Range Status   MRSA by PCR NEGATIVE NEGATIVE Final    Comment:        The GeneXpert MRSA Assay (FDA approved for NASAL specimens only), is one component of a comprehensive MRSA colonization surveillance program. It is not intended to diagnose MRSA infection nor to guide or monitor treatment for MRSA infections.   Urine culture     Status: None   Collection Time: 01/30/16  9:40 PM  Result Value Ref Range Status   Specimen Description URINE, CATHETERIZED  Final   Special Requests NONE  Final   Culture NO GROWTH Performed at [facility], [facility], [facility]   Final   Report Status 02/01/2016 FINAL  Final   Thank you for allowing pharmacy to be a part of this patient's care.  Valrie Hart, PharmD Clinical Pharmacist Pager:  [phone] 02/02/2016 1:36 PM

## 2016-02-03 DIAGNOSIS — E8779 Other fluid overload: Secondary | ICD-10-CM

## 2016-02-03 DIAGNOSIS — R7881 Bacteremia: Secondary | ICD-10-CM

## 2016-02-03 DIAGNOSIS — I4892 Unspecified atrial flutter: Secondary | ICD-10-CM | POA: Diagnosis present

## 2016-02-03 DIAGNOSIS — I350 Nonrheumatic aortic (valve) stenosis: Secondary | ICD-10-CM

## 2016-02-03 LAB — BASIC METABOLIC PANEL
ANION GAP: 6 (ref 5–15)
BUN: 40 mg/dL — AB (ref 6–20)
CHLORIDE: 108 mmol/L (ref 101–111)
CO2: 27 mmol/L (ref 22–32)
Calcium: 7.5 mg/dL — ABNORMAL LOW (ref 8.9–10.3)
Creatinine, Ser: 1.84 mg/dL — ABNORMAL HIGH (ref 0.61–1.24)
GFR calc Af Amer: 40 mL/min — ABNORMAL LOW (ref >60)
GFR, EST NON AFRICAN AMERICAN: 35 mL/min — AB (ref >60)
GLUCOSE: 107 mg/dL — AB (ref 65–99)
POTASSIUM: 3.6 mmol/L (ref 3.5–5.1)
Sodium: 141 mmol/L (ref 135–145)

## 2016-02-03 LAB — GLUCOSE, CAPILLARY
GLUCOSE-CAPILLARY: 164 mg/dL — AB (ref 65–99)
GLUCOSE-CAPILLARY: 157 mg/dL — AB (ref 65–99)
Glucose-Capillary: 90 mg/dL (ref 65–99)
Glucose-Capillary: 160 mg/dL — ABNORMAL HIGH (ref 65–99)

## 2016-02-03 LAB — LEGIONELLA PNEUMOPHILA SEROGP 1 UR AG: L. PNEUMOPHILA SEROGP 1 UR AG: NEGATIVE

## 2016-02-03 MED ORDER — POTASSIUM CHLORIDE CRYS ER 20 MEQ PO TBCR
40.0000 meq | EXTENDED_RELEASE_TABLET | Freq: Two times a day (BID) | ORAL | Status: AC
  Administered 2016-02-03 (×2): 40 meq via ORAL
  Filled 2016-02-03 (×2): qty 2

## 2016-02-03 MED ORDER — LORAZEPAM 2 MG/ML IJ SOLN
0.5000 mg | Freq: Once | INTRAMUSCULAR | Status: AC
  Administered 2016-02-03: 0.5 mg via INTRAVENOUS
  Filled 2016-02-03: qty 1

## 2016-02-03 MED ORDER — PREDNISONE 20 MG PO TABS
20.0000 mg | ORAL_TABLET | Freq: Every day | ORAL | Status: DC
  Administered 2016-02-04: 20 mg via ORAL
  Filled 2016-02-03: qty 1

## 2016-02-03 MED ORDER — FUROSEMIDE 10 MG/ML IJ SOLN
20.0000 mg | Freq: Once | INTRAMUSCULAR | Status: AC
  Administered 2016-02-03: 20 mg via INTRAVENOUS
  Filled 2016-02-03: qty 2

## 2016-02-03 MED ORDER — HYDROCORTISONE 1 % EX CREA
TOPICAL_CREAM | Freq: Two times a day (BID) | CUTANEOUS | Status: DC
  Administered 2016-02-03 – 2016-02-04 (×2): 1 via TOPICAL
  Filled 2016-02-03 (×3): qty 1.5

## 2016-02-03 MED ORDER — DILTIAZEM HCL 60 MG PO TABS
90.0000 mg | ORAL_TABLET | Freq: Two times a day (BID) | ORAL | Status: DC
  Administered 2016-02-03 – 2016-02-04 (×2): 90 mg via ORAL
  Filled 2016-02-03 (×2): qty 1

## 2016-02-03 MED ORDER — FERROUS SULFATE 325 (65 FE) MG PO TABS
325.0000 mg | ORAL_TABLET | Freq: Two times a day (BID) | ORAL | Status: DC
  Administered 2016-02-03 – 2016-02-04 (×3): 325 mg via ORAL
  Filled 2016-02-03 (×3): qty 1

## 2016-02-03 NOTE — Progress Notes (Signed)
EKG completed. Dr. Sherrie Mustache notified of results.

## 2016-02-03 NOTE — Care Management Important Message (Signed)
Important Message  Patient Details  Name: WENTWORTH EDELEN MRN: [id number] Date of Birth: Sep 27, 1940   Medicare Important Message Given:  Yes    Adonis Huguenin, RN 02/03/2016, 8:54 AM

## 2016-02-03 NOTE — Care Management Note (Signed)
Case Management Note  Patient Details  Name: Patrick Tate MRN: [id number] Date of Birth: [birth year]  Subjective/Objective:Spoke with patient and spouse who are from home with W/C and handicapped accessible home. Patient has Shower chair and BSC. Patient alert but confused Spouse stated that  They have had [facility] in the past and would like to continue.  Referral placed with Therisa Doyne.                     Action/Plan:Home with Home Health.   Expected Discharge Date:                  Expected Discharge Plan:  Home w Home Health Services  In-House Referral:  NA  Discharge planning Services  CM Consult  Post Acute Care Choice:  Home Health Choice offered to:  Spouse, Patient  DME Arranged:    DME Agency:     HH Arranged:    HH Agency:  [facility]  Status of Service:  In process, will continue to follow  Medicare Important Message Given:  Yes Date Medicare IM Given:    Medicare IM give by:    Date Additional Medicare IM Given:    Additional Medicare Important Message give by:     If discussed at Long Length of Stay Meetings, dates discussed:    Additional Comments:  Adonis Huguenin, RN 02/03/2016, 3:08 PM

## 2016-02-03 NOTE — Progress Notes (Signed)
Patient is getting agitated and pulling off his equipment, paged on call MD, will follow any orders received and continue to monitor the patient.

## 2016-02-03 NOTE — Progress Notes (Signed)
PROGRESS NOTE    Patrick Tate  HYI:[id number] DOB: [birth year] DOA: 01/30/2016 PCP: Josue Hector, MD   Brief Narrative:  75 -year-old man with a history of diabetes mellitus, bilateral lower extremity amputee, steroid dependent rheumatoid arthritis, and hypertension, who presented on 01/30/2016 for generalized weakness and chest pain. In the ED, his temperature was 101.3. His blood pressure was on the lower end of normal and he was mildly tachycardic. He was oxygenating 88% on room air. His lab data were significant for an elevated lactic acid level, creatinine of 230, W BC of 13.5, hemoglobin of 10.9. His chest x-ray revealed bibasilar airspace disease left greater than right, suspicious for pneumonia. He was admitted for further evaluation and management.   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 Sepsis with gram-negative bacteremia, secondary to RLE cellulitis and CAP On admission , the patient was febrile with a temperature of 101.3, relatively hypotensive, and tachycardic. His lactic acid was elevated and his white blood cell count was 13.5.  CXR revealed left greater than right airspace disease consistent with pneumonia. His  UA  was essentially negative. He was started on IV Rocephin and azithromycin. Vigorous IV fluids were given. Stress dose hydrocortisone was started due to his chronic prednisone therapy for  RA. -Strep pneumo antigen was negative. -Blood cultures revealed one out of 2 becoming positive for gram negative coccobacilli identified as Acinetobacter (previously erroneously identified as gram-positive cocci).  -Sepsis parameters have improved. Leukocytosis is persisting, possibly secondary to IV steroid versus bacteremia. -Discussed with pharmacist, [name] on 02/02/16. To cover Acinetobacter bacteremia, pneumonia, and cellulitis, antibiotic therapy will be changed to Zosyn alone. -We'll recheck another set of blood cultures in the next day or 2.  Community-acquired pneumonia. -Treatment started as above in #1. Patient has a minimal nonproductive cough. He appears to be oxygenating in the mid 90s on room air. -Follow-up chest x-ray on 02/02/16 revealed persistent but improved bilateral lower lobe opacities.  Acute respiratory failure with hypoxia, secondary to CAP. Patient's oxygen saturation dipped down to 88% on room air in the ED. Supplemental oxygen was applied. His oxygen saturations have improved.  Right lower extremity cellulitis. Patient was started on vancomycin for treatment. His erythema/cellulitis appears to be resolving. Vancomycin was discontinued in favor of Zosyn to cover all 3 infections.  Volume overload. Patient has bilateral upper extremity edema and bilateral stump edema. The etiology is likely from vigorous IV fluid hydration initially for treatment of sepsis. Follow-up chest x-ray revealed bibasilar opacities slightly improved. 2-D echocardiogram revealed severe AS, indeterminate diastolic function, an EF of 50-55%. Patient was given one 20 mg IV dose of Lasix with improvement; less peripheral edema. -We'll give him another dose of Lasix and then consider starting daily oral dose.  Severe aortic stenosis. Echo ordered to evaluate for volume overload. The results were significant for an EF of 55-60%, no wall motion abnormality  is, indeterminate LV diastolic function, and  severe aortic stenosis. Patient does have an aortic valve murmur on exam. It is not clear if the volume overload can be attributed to aortic stenosis versus volume overload from vigorous IV fluids given on admission. He has some ectopy on heart exam, so will check a follow-up EKG. -We'll continue supportive treatment and when necessary Lasix. -We'll set up an appointment for the patient to see cardiology as an outpatient.  Insulin-dependent diabetes mellitus. Patient is treated with Lantus and sliding scale Humalog at home. His blood sugars increased on IV hydrocortisone. Dose of Lantus was increased on 01/31/16. -IV hydrocortisone was decreased which decreased his CBGs precipitously. Lantus dosing was decreased to avoid symptomatic hypoglycemia. -Patient's A1c was 8.3.  Acute kidney injury superimposed on stage III chronic kidney disease. Patient's creatinine was 2.3 on admission. 11 months ago was 1.72 and one year ago it was 2.18. Patient was vigorously hydrated for the first 24 hours. His creatinine has improved to 1.84. -Etiology may be ATN due to sepsis. We'll continue to monitor closely.  Normocytic anemia. Patient has a history of iron deficiency and had been taking iron supplements at home. Patient's hemoglobin was 9.9 on admission. He probably has concomitant anemia of chronic disease from rheumatoid arthritis. It has drifted down to 8.5. -His stool was Hemoccult negative 1. Ferritin was 148, total iron was low at 7, and B12 was within normal limits at 834. -Ferrous sulfate restarted.  Rheumatoid arthritis. Patient is treated with chronic prednisone. He was started on stress dose IV hydrocortisone. - IV hydrocortisone was titrated down and will eventually taper back to home dose of prednisone.  Hypertension. Patient is treated chronically with amlodipine and diltiazem. They were both placed on hold on admission due to patient's systolic blood pressure being in the 90s. His blood  pressure has improved. Diltiazem was restarted at a lower dose.  Hypokalemia. Patient's serum potassium drifted down from normal to 2.9. Potassium chloride supplementation was started with improvement. We'll continue supplementation.  Right greater than left upper extremity edema. Venous Doppler ultrasound was negative for DVT. Etiology may be secondary to the blood pressure cuff impeding the venous flow and volume overload from vigorous IV fluids on admission. RN notified and a larger cuff was requested; elevate right arm.  BPH. He was continued on finasteride.  Anxiety. He was continued on Cymbalta and Xanax.  Stage I sacral ulcer. Barrier management per nursing.  Query sundowning. Patient became agitated and pulled off his telemetry early this morning. He was given lorazepam with good effect.       DVT prophylaxis: Lovenox  Code Status: DNR Family Communication: Discussed with patient's wife. Disposition Plan: Discharge when clinically appropriate, likely in 1-2 days.   Consultants:   None  Procedures: 2-D echo 02/02/16:- Left ventricle: The cavity size was normal. Wall thickness was  increased in a pattern of moderate LVH. Systolic function was  normal. The estimated ejection fraction was in the range of 55%  to 60%. Wall motion was normal; there were no regional wall  motion abnormalities. The study is not technically sufficient to  allow evaluation of LV diastolic function. - Aortic valve: Moderately calcified annulus. Probably trileaflet;  moderately to severely calcified leaflets. Cusp separation was  moderately reduced. There was severe stenosis. Mean gradient (S):  39 mm Hg. Peak gradient (S): 61 mm Hg. VTI ratio of LVOT to  aortic valve: 0.19. Valve area (VTI): 0.6 cm^2. Valve area  (Vmax): 0.63 cm^2. -  Mitral valve: Calcified annulus. Mild calcification of the  anterior leaflet. There was mild to moderate regurgitation. - Left atrium: The atrium  was severely dilated. - Right atrium: The atrium was moderately dilated. Central venous  pressure (est): 3 mm Hg. - Tricuspid valve: There was mild regurgitation. - Pulmonary arteries: Systolic pressure could not be accurately  estimated. - Pericardium, extracardiac: There was a left pleural effusion. Impressions: - Moderate LVH with LVEF 55-60%. Indeterminate diastolic function,  does not appear to be in sinus rhythm. Severe left atrial  enlargement. MAC with calcified mitral valve and mild to moderate  mitral regurgitation. Severe calcific aortic stenosis as outlined  above. Moderate right atrial enlargement. Mild tricuspid  regurgitation.  Antimicrobials:    Zosyn 02/02/16>>  Zithromax 5/22>> 02/02/16  Vancomycin 5/22>> 02/02/16  Rocephin 5/22>> 02/02/16   Subjective: Nurse reports the patient became confused and agitated this morning, pulling off his telemetry. Currently, patient is calm and in no acute distress. He does not recall being confused earlier. He has no complaints of chest pain or shortness of breath.  Objective: Filed Vitals:   02/02/16 2028 02/02/16 2059 02/03/16 0611 02/03/16 0754  BP:  120/64 122/64   Pulse:  88 76   Temp:  98.1 F (36.7 C) 98.1 F (36.7 C)   TempSrc:  Oral Oral   Resp:  18 20   Height:      Weight:      SpO2: 96% 95% 94% 91%    Intake/Output Summary (Last 24 hours) at 02/03/16 1327 Last data filed at 02/03/16 1300  Gross per 24 hour  Intake    770 ml  Output   1650 ml  Net   -880 ml   Filed Weights   01/31/16 0500 02/01/16 0500 02/02/16 0607  Weight: 97 kg (213 lb 13.5 oz) 95.5 kg (210 lb 8.6 oz) 95.4 kg (210 lb 5.1 oz)    Examination:  General exam: Appears calm and comfortable  Respiratory system: Few crackles at the bases; breathing nonlabored. Cardiovascular system: S1 & S2 heard with ectopy and a 2/6 systolic murmur. Gastrointestinal system: Abdomen is mildly obese nondistended, soft and nontender.  Normal  bowel sounds heard. Central nervous system: Alert and oriented. No focal neurological deficits. Extremities: right lower extremity stump with scant erythema and trace lower extremity edema. Left stump with trace of edema. Right and left upper extremities with 1+, Down from 2+ pitting edema. Skin: Mild erythema over right lower extremity stump Psychiatry: Drowsy but arousable and alert and oriented to himself, wife, and place.     Data Reviewed: I have personally reviewed following labs and imaging studies  CBC:  Recent Labs Lab 01/30/16 0920 01/30/16 2039 01/31/16 0837 02/01/16 0351 02/02/16 0622  WBC 13.5* 11.1* 14.4* 13.4* 15.0*  NEUTROABS 11.7*  --   --   --   --   HGB 10.9* 9.9* 10.3* 8.7* 8.5*  HCT 36.9* 33.1* 34.5* 28.6* 27.7*  MCV 83.1 82.5 83.3 82.2 79.8  PLT 367 225 265 268 292   Basic Metabolic Panel:  Recent Labs Lab 01/30/16 0920 01/30/16 2039 02/01/16 0351 02/02/16 0622 02/03/16 0517  NA 139 137 137 138 141  K 3.6 4.2 3.7 2.9* 3.6  CL 102 109 103 104 108  CO2 26 22 26 27 27   GLUCOSE 211* 162* 206* 90 107*  BUN 29* 29* 38* 39* 40*  CREATININE 2.30* 2.29* 2.39* 1.90* 1.84*  CALCIUM 9.7 8.2* 8.0* 7.9* 7.5*   GFR: Estimated Creatinine Clearance: 34.5  mL/min (by C-G formula based on Cr of 1.84). Liver Function Tests:  Recent Labs Lab 01/30/16 0920 01/30/16 2039  AST 17 16  ALT 18 16*  ALKPHOS 96 70  BILITOT 0.6 0.6  PROT 6.4* 5.0*  ALBUMIN 2.9* 2.3*   No results for input(s): LIPASE, AMYLASE in the last 168 hours. No results for input(s): AMMONIA in the last 168 hours. Coagulation Profile: No results for input(s): INR, PROTIME in the last 168 hours. Cardiac Enzymes: No results for input(s): CKTOTAL, CKMB, CKMBINDEX, TROPONINI in the last 168 hours. BNP (last 3 results) No results for input(s): PROBNP in the last 8760 hours. HbA1C: No results for input(s): HGBA1C in the last 72 hours. CBG:  Recent Labs Lab 02/02/16 1153 02/02/16 1711  02/02/16 2059 02/03/16 0812 02/03/16 1143  GLUCAP 101* 171* 149* 90 160*   Lipid Profile: No results for input(s): CHOL, HDL, LDLCALC, TRIG, CHOLHDL, LDLDIRECT in the last 72 hours. Thyroid Function Tests: No results for input(s): TSH, T4TOTAL, FREET4, T3FREE, THYROIDAB in the last 72 hours. Anemia Panel:  Recent Labs  02/02/16 0623  VITAMINB12 834  FERRITIN 148  TIBC 206*  IRON 7*   Urine analysis:    Component Value Date/Time   COLORURINE YELLOW 01/31/2016 0728   APPEARANCEUR CLEAR 01/31/2016 0728   LABSPEC <1.005* 01/31/2016 0728   PHURINE 5.0 01/31/2016 0728   GLUCOSEU NEGATIVE 01/31/2016 0728   HGBUR TRACE* 01/31/2016 0728   BILIRUBINUR NEGATIVE 01/31/2016 0728   KETONESUR NEGATIVE 01/31/2016 0728   PROTEINUR NEGATIVE 01/31/2016 0728   UROBILINOGEN 0.2 07/10/2007 1447   NITRITE NEGATIVE 01/31/2016 0728   LEUKOCYTESUR NEGATIVE 01/31/2016 0728   Sepsis Labs: @LABRCNTIP (procalcitonin:4,lacticidven:4)  ) Recent Results (from the past 240 hour(s))  Culture, blood (Routine x 2)     Status: None (Preliminary result)   Collection Time: 01/30/16  9:20 AM  Result Value Ref Range Status   Specimen Description BLOOD LEFT ANTECUBITAL DRAWN BY RN LR  Final   Special Requests BOTTLES DRAWN AEROBIC AND ANAEROBIC 6CC EACH  Final   Culture NO GROWTH 4 DAYS  Final   Report Status PENDING  Incomplete  Culture, blood (Routine x 2)     Status: Abnormal   Collection Time: 01/30/16  9:30 AM  Result Value Ref Range Status   Specimen Description BLOOD RIGHT ANTECUBITAL  Final   Special Requests BOTTLES DRAWN AEROBIC AND ANAEROBIC 6CC EACH  Final   Culture  Setup Time   Final    GRAM POSITIVE COCCI ANAEROBIC BOTTLE ONLY Gram Stain Report Called to,Read Back By and Verified With: MORRIS,C ON 01/31/16 @0840  BY MITCHELL,S    Culture (A)  Final    ACINETOBACTER SPECIES CORRECTED RESULTS GRAM NEGATIVE COCCOBACILLI CORRECTED RESULTS CALLED TO: L5/25/17, PHARM AT 1230 ON BY Dutch Quint Performed at [facility]    Report Status 02/02/2016 FINAL  Final   Organism ID, Bacteria ACINETOBACTER SPECIES  Final      Susceptibility   Acinetobacter species - MIC*    CEFTAZIDIME 4 SENSITIVE Sensitive     CEFTRIAXONE 16 INTERMEDIATE Intermediate     CIPROFLOXACIN <=0.25 SENSITIVE Sensitive     GENTAMICIN <=1 SENSITIVE Sensitive     IMIPENEM <=0.25 SENSITIVE Sensitive     PIP/TAZO <=4 SENSITIVE Sensitive     TRIMETH/SULFA <=20 SENSITIVE Sensitive     AMPICILLIN/SULBACTAM <=2 SENSITIVE Sensitive     * ACINETOBACTER SPECIES  Blood Culture ID Panel (Reflexed)     Status: None   Collection Time:  01/30/16  9:30 AM  Result Value Ref Range Status   Enterococcus species NOT DETECTED NOT DETECTED Final   Vancomycin resistance NOT DETECTED NOT DETECTED Final   Listeria monocytogenes NOT DETECTED NOT DETECTED Final   Staphylococcus species NOT DETECTED NOT DETECTED Final   Staphylococcus aureus NOT DETECTED NOT DETECTED Final   Methicillin resistance NOT DETECTED NOT DETECTED Final   Streptococcus species NOT DETECTED NOT DETECTED Final   Streptococcus agalactiae NOT DETECTED NOT DETECTED Final   Streptococcus pneumoniae NOT DETECTED NOT DETECTED Final   Streptococcus pyogenes NOT DETECTED NOT DETECTED Final   Acinetobacter baumannii NOT DETECTED NOT DETECTED Final   Enterobacteriaceae species NOT DETECTED NOT DETECTED Final   Enterobacter cloacae complex NOT DETECTED NOT DETECTED Final   Escherichia coli NOT DETECTED NOT DETECTED Final   Klebsiella oxytoca NOT DETECTED NOT DETECTED Final   Klebsiella pneumoniae NOT DETECTED NOT DETECTED Final   Proteus species NOT DETECTED NOT DETECTED Final   Serratia marcescens NOT DETECTED NOT DETECTED Final   Carbapenem resistance NOT DETECTED NOT DETECTED Final   Haemophilus influenzae NOT DETECTED NOT DETECTED Final   Neisseria meningitidis NOT DETECTED NOT DETECTED Final   Pseudomonas aeruginosa NOT DETECTED NOT DETECTED  Final   Candida albicans NOT DETECTED NOT DETECTED Final   Candida glabrata NOT DETECTED NOT DETECTED Final   Candida krusei NOT DETECTED NOT DETECTED Final   Candida parapsilosis NOT DETECTED NOT DETECTED Final   Candida tropicalis NOT DETECTED NOT DETECTED Final    Comment: Performed at [facility]  MRSA PCR Screening     Status: None   Collection Time: 01/30/16  1:15 PM  Result Value Ref Range Status   MRSA by PCR NEGATIVE NEGATIVE Final    Comment:        The GeneXpert MRSA Assay (FDA approved for NASAL specimens only), is one component of a comprehensive MRSA colonization surveillance program. It is not intended to diagnose MRSA infection nor to guide or monitor treatment for MRSA infections.   Urine culture     Status: None   Collection Time: 01/30/16  9:40 PM  Result Value Ref Range Status   Specimen Description URINE, CATHETERIZED  Final   Special Requests NONE  Final   Culture NO GROWTH Performed at [facility]   Final   Report Status 02/01/2016 FINAL  Final         Radiology Studies: Dg Chest Port 1 View  02/02/2016  CLINICAL DATA:  Community-acquired pneumonia EXAM: PORTABLE CHEST 1 VIEW COMPARISON:  01/30/2016 FINDINGS: Stable cardiac enlargement. Mild vascular congestion. Bilateral lower lobe opacities again identified but mildly to moderately improved. IMPRESSION: Persistent but improved bilateral lower lobe opacifications Electronically Signed   By: Esperanza Heir M.D.   On: 02/02/2016 13:54        Scheduled Meds: . ALPRAZolam  0.5 mg Oral QHS  . antiseptic oral rinse  7 mL Mouth Rinse BID  . aspirin EC  81 mg Oral Daily  . atorvastatin  20 mg Oral q1800  . diltiazem  30 mg Oral Q12H  . DULoxetine  30 mg Oral Daily  . enoxaparin (LOVENOX) injection  40 mg Subcutaneous Q24H  . ferrous sulfate  325 mg Oral BID WC  . finasteride  5 mg Oral Daily  . guaiFENesin  1,200 mg Oral BID  . hydrocortisone sod succinate (SOLU-CORTEF) inj  25  mg Intravenous Q12H  . insulin aspart  0-15 Units Subcutaneous TID WC  . insulin aspart  0-5  Units Subcutaneous QHS  . insulin glargine  20 Units Subcutaneous QHS  . ipratropium-albuterol  3 mL Nebulization TID  . piperacillin-tazobactam (ZOSYN)  IV  3.375 g Intravenous Q8H   Continuous Infusions:    LOS: 4 days    Time spent: 35 mins    Joanette Gula, M.D. [facility] Pager [phone]  If 7PM-7AM, please contact night-coverage [email or web address] Password [email or web address] 02/03/2016, 1:27 PM

## 2016-02-04 DIAGNOSIS — I4892 Unspecified atrial flutter: Secondary | ICD-10-CM

## 2016-02-04 LAB — BASIC METABOLIC PANEL
Anion gap: 8 (ref 5–15)
BUN: 33 mg/dL — AB (ref 6–20)
CHLORIDE: 107 mmol/L (ref 101–111)
CO2: 24 mmol/L (ref 22–32)
CREATININE: 1.69 mg/dL — AB (ref 0.61–1.24)
Calcium: 7.1 mg/dL — ABNORMAL LOW (ref 8.9–10.3)
GFR calc Af Amer: 45 mL/min — ABNORMAL LOW (ref >60)
GFR calc non Af Amer: 38 mL/min — ABNORMAL LOW (ref >60)
Glucose, Bld: 61 mg/dL — ABNORMAL LOW (ref 65–99)
Potassium: 3.3 mmol/L — ABNORMAL LOW (ref 3.5–5.1)
SODIUM: 139 mmol/L (ref 135–145)

## 2016-02-04 LAB — GLUCOSE, CAPILLARY
Glucose-Capillary: 56 mg/dL — ABNORMAL LOW (ref 65–99)
Glucose-Capillary: 136 mg/dL — ABNORMAL HIGH (ref 65–99)
Glucose-Capillary: 134 mg/dL — ABNORMAL HIGH (ref 65–99)

## 2016-02-04 LAB — CULTURE, BLOOD (ROUTINE X 2): Culture: NO GROWTH

## 2016-02-04 LAB — BRAIN NATRIURETIC PEPTIDE: B NATRIURETIC PEPTIDE 5: 603 pg/mL — AB (ref 0.0–100.0)

## 2016-02-04 LAB — CBC
HEMATOCRIT: 29.1 % — AB (ref 39.0–52.0)
HEMOGLOBIN: 8.8 g/dL — AB (ref 13.0–17.0)
MCH: 24.9 pg — AB (ref 26.0–34.0)
MCHC: 30.2 g/dL (ref 30.0–36.0)
MCV: 82.2 fL (ref 78.0–100.0)
PLATELETS: 363 10*3/uL (ref 150–400)
RBC: 3.54 MIL/uL — AB (ref 4.22–5.81)
RDW: 17.1 % — AB (ref 11.5–15.5)
WBC: 9.7 10*3/uL (ref 4.0–10.5)

## 2016-02-04 MED ORDER — FAMOTIDINE 20 MG PO TABS
20.0000 mg | ORAL_TABLET | Freq: Two times a day (BID) | ORAL | Status: DC
  Administered 2016-02-04: 20 mg via ORAL
  Filled 2016-02-04: qty 1

## 2016-02-04 MED ORDER — POTASSIUM CHLORIDE CRYS ER 20 MEQ PO TBCR
30.0000 meq | EXTENDED_RELEASE_TABLET | Freq: Two times a day (BID) | ORAL | Status: DC
  Administered 2016-02-04: 30 meq via ORAL
  Filled 2016-02-04: qty 1

## 2016-02-04 MED ORDER — ENOXAPARIN SODIUM 40 MG/0.4ML ~~LOC~~ SOLN
40.0000 mg | SUBCUTANEOUS | Status: DC
Start: 2016-02-04 — End: 2016-02-04

## 2016-02-04 MED ORDER — INSULIN LISPRO 100 UNIT/ML ~~LOC~~ SOLN: 10.0000 [IU] | Freq: Three times a day (TID) | SUBCUTANEOUS | Status: AC

## 2016-02-04 MED ORDER — FAMOTIDINE 20 MG PO TABS: 20.0000 mg | ORAL_TABLET | Freq: Every day | ORAL | Status: AC

## 2016-02-04 MED ORDER — ASPIRIN EC 81 MG PO TBEC: 162.0000 mg | DELAYED_RELEASE_TABLET | Freq: Every day | ORAL | Status: DC

## 2016-02-04 MED ORDER — RIVAROXABAN 15 MG PO TABS
15.0000 mg | ORAL_TABLET | Freq: Every day | ORAL | Status: DC
  Filled 2016-02-04: qty 1

## 2016-02-04 MED ORDER — ASPIRIN EC 81 MG PO TBEC: 162.0000 mg | DELAYED_RELEASE_TABLET | Freq: Every day | ORAL | Status: AC

## 2016-02-04 MED ORDER — POTASSIUM CHLORIDE CRYS ER 20 MEQ PO TBCR: 40.0000 meq | EXTENDED_RELEASE_TABLET | Freq: Every day | ORAL | Status: AC

## 2016-02-04 MED ORDER — DILTIAZEM HCL 60 MG PO TABS
90.0000 mg | ORAL_TABLET | Freq: Three times a day (TID) | ORAL | Status: DC
  Administered 2016-02-04: 90 mg via ORAL
  Filled 2016-02-04: qty 1

## 2016-02-04 MED ORDER — AMOXICILLIN-POT CLAVULANATE 500-125 MG PO TABS: 1.0000 | ORAL_TABLET | Freq: Two times a day (BID) | ORAL | Status: AC

## 2016-02-04 MED ORDER — INSULIN GLARGINE 100 UNIT/ML ~~LOC~~ SOLN: 15.0000 [IU] | Freq: Every day | SUBCUTANEOUS | Status: AC

## 2016-02-04 MED ORDER — FUROSEMIDE 20 MG PO TABS: 10.0000 mg | ORAL_TABLET | Freq: Every day | ORAL | Status: AC

## 2016-02-04 NOTE — Progress Notes (Signed)
EKG done, results given to Dr. Sherrie Mustache.

## 2016-02-04 NOTE — Progress Notes (Signed)
Patient alert and oriented, independent, VSS, pt. Tolerating diet well. No complaints of pain or nausea. Pt. Had IV removed tip intact. Pt. Had prescriptions given. Pt. Voiced understanding of discharge instructions with no further questions. Pt. Discharged via wheelchair with auxilliary.  

## 2016-02-04 NOTE — Discharge Summary (Signed)
Physician Discharge Summary  Patrick Tate ZOX:[id number] DOB: Apr 03, 1941 DOA: 01/30/2016  PCP: Patrick Hector, MD  Admit date: 01/30/2016 Discharge date: 02/04/2016  Time spent: Greater than 30 minutes  Recommendations for Outpatient Follow-up:  1. Starting anticoagulation for atrial flutter was deferred to cardiology. Anticoagulation not started due to patient's anemia and chronic debilitation. 2. Recommend follow-up of repeat blood cultures pending. 3. Recommend follow-up of the patient's H/H, potassium, and renal function.     Discharge Diagnoses:  1. Sepsis secondary to right lower extremity cellulitis, CAP, and Acinetobacter bacteremia, 2. Acute respiratory failure with hypoxia secondary to CAP. Resolved. 3. Volume overload, thought to be secondary to vigorous IV fluids given on admission. 4. Severe aortic valve stenosis per echo. 5. Atrial flutter with brief RVR. Per history given, the atrial flutter does not appear to be new. 6. Hypertension. 7. Insulin-requiring diabetes mellitus. 8. Acute kidney injury superimposed on stage III chronic kidney disease. 9. Normocytic iron deficiency anemia. 10. Rheumatoid arthritis, on chronic prednisone. 11. Hypokalemia, exacerbated by Lasix. 12. BPH, on finasteride. 13. Chronic anxiety, stable. 14. Stage I sacral ulcer. 15. Bilateral amputee secondary to crush injury and complication from the surgery in the past. 14. HLD.   Discharge Condition: Improved.  Diet recommendation: Heart healthy/carbohydrate modified.  Filed Weights   01/31/16 0500 02/01/16 0500 02/02/16 0607  Weight: 97 kg (213 lb 13.5 oz) 95.5 kg (210 lb 8.6 oz) 95.4 kg (210 lb 5.1 oz)    History of present illness:  75 -year-old man with a history of diabetes mellitus, bilateral lower extremity amputee, steroid dependent rheumatoid arthritis, and hypertension, who presented on 01/30/2016 for generalized weakness and chest pain. In the ED, his temperature was  101.3. His blood pressure was on the lower end of normal and he was mildly tachycardic. He was oxygenating 88% on room air. His lab data were significant for an elevated lactic acid level, creatinine of 230, W BC of 13.5, hemoglobin of 10.9. His chest x-ray revealed bibasilar airspace disease left greater than right, suspicious for pneumonia. He was admitted for further evaluation and management.  Hospital Course:   1. Sepsis, secondary to RLE cellulitis,  CAP, and gram-negative bacteremia. On admission , the patient was febrile with a temperature of 101.3, relatively hypotensive, and tachycardic. His lactic acid was elevated and his white blood cell count was 13.5. CXR revealed left greater than right airspace disease consistent with pneumonia. His UA was essentially negative. He was started on IV Rocephin and azithromycin. Vigorous IV fluids were given. Stress dose hydrocortisone was started due to his chronic prednisone therapy for RA. -Strep pneumo antigen was negative. -Blood cultures revealed one out of 2 becoming positive for gram negative coccobacilli identified as Acinetobacter, sensitive to Zosyn and Augmentin.  -Discussed with pharmacist, Patrick Tate on 02/02/16. To cover Acinetobacter bacteremia, pneumonia, and cellulitis, antibiotic therapy will be changed to Zosyn alone. -Another set of blood cultures were ordered and were pending at the time of discharge, but were negative to date. -Patient was discharged on 4 more days of Augmentin.  Community-acquired pneumonia. -Treatment started as above in #1. Patient improved clinically and symptomatically. -Follow-up chest x-ray on 02/02/16 revealed persistent but improved bilateral lower lobe opacities.  Acute respiratory failure with hypoxia, secondary to CAP. Patient's oxygen saturation fell to 88% on room air in the ED. Supplemental oxygen was applied. His oxygen saturations have improved. He was oxygenating in the mid 90s on room air at the  time of discharge.  Right lower extremity  cellulitis. Patient was started on vancomycin for treatment.  Vancomycin was discontinued in favor of Zosyn to cover all 3 infections. His cellulitis resolved.  Volume overload. Patient developed bilateral upper extremity edema and bilateral stump edema. The etiology is likely from vigorous IV fluid hydration initially for treatment of sepsis. Right upper extremity ultrasound was negative for DVT. Follow-up chest x-ray revealed bibasilar opacities slightly improved. 2-D echocardiogram revealed severe AS, indeterminate diastolic function, an EF of 50-55%. Patient was given  20 mg IV dose of Lasix 2 with near resolution of his edema. -He was discharged on 10 mg of Lasix daily.  Atrial flutter. During the hospital course, patient was later found to have ectopy and tachycardia on exam. EKG revealed atrial flutter. On admission, his EKG revealed atrial tachycardia. He is treated chronically with diltiazem, but it was held for the first few days of the hospitalization. This may have prompted atrial flutter along with his pulmonary illness. Diltiazem was restarted with improvement in his heart rate. -I discussed this finding with the patient's wife who informed me that the patient has a history of an irregular heart rhythm dating back 5 years ago during a hospitalization for pneumonia at [facility].Marland Kitchen He had been treated with 90 mg of diltiazem 4 times a day, but it was decreased to twice daily by his PCP. He was also told to take aspirin. This points to the probability that his atrial flutter was not new. -His CHADSVASC score was calculated as 3. Starting Xarelto for anticoagulation was considered, but the patient has iron deficiency anemia and is quite debilitated, therefore, it was not started. Aspirin was increased to 162 mg daily. -A new patient appointment was scheduled for the patient to see cardiology on 02/14/16. Would defer evaluation by cardiology to determine  if the patient needs anticoagulation. The patient and his wife were in agreement with this decision.  Severe aortic stenosis. Echo ordered to evaluate for volume overload and atrial flutter.. The results were significant for an EF of 55-60%, no wall motion abnormality is, indeterminate LV diastolic function, and severe aortic stenosis. Patient does have an aortic valve murmur on exam. It is not clear if the volume overload could have been attributed to aortic stenosis versus volume overload from vigorous IV fluids given on admission. -He was discharged on 10 mg of Lasix daily. ACE inhibitor therapy was not given due to his low-normal blood pressures. -An appointment has been made for the patient to see cardiology as an outpatient.  Insulin-dependent diabetes mellitus. Patient is treated with Lantus and sliding scale Humalog at home. His blood sugars increased on IV hydrocortisone. Dose of Lantus was increased on 01/31/16. -IV hydrocortisone was decreased which decreased his CBGs precipitously. Lantus dosing was decreased to avoid symptomatic hypoglycemia. -Patient's A1c was 8.3.  Acute kidney injury superimposed on stage III chronic kidney disease. Patient's creatinine was 2.3 on admission. 11 months ago was 1.72 and one year ago it was 2.18. Patient was vigorously hydrated for the first 48 hours. His creatinine has improved to 1.69 at discharge. -Etiology may have been ATN due to sepsis.  Normocytic anemia. Patient has a history of iron deficiency and had been taking iron supplements at home. Patient's hemoglobin was 9.9 on admission. He probably has concomitant anemia of chronic disease from rheumatoid arthritis. It has drifted down to 8.5. -His stool was Hemoccult negative 1. Ferritin was 148, total iron was low at 7, and B12 was within normal limits at 834. -Ferrous sulfate restarted.  Rheumatoid  arthritis. Patient is treated with chronic prednisone. He was started on stress dose IV  hydrocortisone. - IV hydrocortisone was titrated off and prednisone was restarted.  Hypertension. Patient is treated chronically with amlodipine and diltiazem. They were both placed on hold on admission due to patient's systolic blood pressure being in the 90s. His blood pressure improved and diltiazem was restarted as above. Norvasc was not restarted.  Hypokalemia. Patient's serum potassium drifted down from normal to 2.9. Potassium chloride supplementation was started with improvement. He was continued on potassium chloride supplementation.  BPH. He was continued on finasteride.  Anxiety. He was continued on Cymbalta and Xanax.  Stage I sacral ulcer. Barrier management was given per nursing.   Procedures: 2-D echo 02/02/16:- Left ventricle: The cavity size was normal. Wall thickness was  increased in a pattern of moderate LVH. Systolic function was  normal. The estimated ejection fraction was in the range of 55%  to 60%. Wall motion was normal; there were no regional wall  motion abnormalities. The study is not technically sufficient to  allow evaluation of LV diastolic function. - Aortic valve: Moderately calcified annulus. Probably trileaflet;  moderately to severely calcified leaflets. Cusp separation was  moderately reduced. There was severe stenosis. Mean gradient (S):  39 mm Hg. Peak gradient (S): 61 mm Hg. VTI ratio of LVOT to  aortic valve: 0.19. Valve area (VTI): 0.6 cm^2. Valve area  (Vmax): 0.63 cm^2. - Mitral valve: Calcified annulus. Mild calcification of the  anterior leaflet. There was mild to moderate regurgitation. - Left atrium: The atrium was severely dilated. - Right atrium: The atrium was moderately dilated. Central venous  pressure (est): 3 mm Hg. - Tricuspid valve: There was mild regurgitation. - Pulmonary arteries: Systolic pressure could not be accurately  estimated. - Pericardium, extracardiac: There was a left pleural  effusion. Impressions: - Moderate LVH with LVEF 55-60%. Indeterminate diastolic function,  does not appear to be in sinus rhythm. Severe left atrial  enlargement. MAC with calcified mitral valve and mild to moderate  mitral regurgitation. Severe calcific aortic stenosis as outlined  above. Moderate right atrial enlargement. Mild tricuspid  regurgitation.  Consultations:  None  Discharge Exam: Filed Vitals:   02/04/16 1419 02/04/16 1511  BP: 116/89   Pulse:  67  Temp: 98.5 F (36.9 C)   Resp: 16     General: 75 year old chronically debilitated appearing man in no acute distress, but overall he looks better clinically. Cardiovascular: S1, S2, with ectopy and a 2 to 3/6 systolic murmur. Respiratory: Clear anteriorly with decreased breath sounds in the bases. No audible wheezes or crackles. Extremities: Near resolution of upper extremity and lower extremity edema.  Discharge Instructions   Discharge Instructions    Diet - low sodium heart healthy    Complete by:  As directed      Diet Carb Modified    Complete by:  As directed      Discharge instructions    Complete by:  As directed   There have been some medication changes and some new medications, so review the discharge medications closely.     Increase activity slowly    Complete by:  As directed           Current Discharge Medication List    START taking these medications   Details  amoxicillin-clavulanate (AUGMENTIN) 500-125 MG tablet Take 1 tablet (500 mg total) by mouth 2 (two) times daily. Starting 02/05/16 take antibiotic as directed until completed. Qty: 8 tablet, Refills: 0  famotidine (PEPCID) 20 MG tablet Take 1 tablet (20 mg total) by mouth daily.    furosemide (LASIX) 20 MG tablet Take 0.5 tablets (10 mg total) by mouth daily. Medication to treat swelling. Qty: 15 tablet, Refills: 2      CONTINUE these medications which have CHANGED   Details  aspirin EC 81 MG tablet Take 2 tablets (162 mg  total) by mouth daily.    insulin glargine (LANTUS) 100 UNIT/ML injection Inject 0.15 mLs (15 Units total) into the skin at bedtime.    insulin lispro (HUMALOG) 100 UNIT/ML injection Inject 0.1-0.15 mLs (10-15 Units total) into the skin 3 (three) times daily before meals. Do not take this insulin if your blood sugar is below 120.    potassium chloride SA (K-DUR,KLOR-CON) 20 MEQ tablet Take 2 tablets (40 mEq total) by mouth daily. Qty: 60 tablet, Refills: 3      CONTINUE these medications which have NOT CHANGED   Details  acetaminophen (TYLENOL) 650 MG CR tablet Take 1,300 mg by mouth every 8 (eight) hours as needed for pain.    ALPRAZolam (XANAX) 0.5 MG tablet Take 0.5 mg by mouth at bedtime.    atorvastatin (LIPITOR) 20 MG tablet Take 20 mg by mouth daily.    Calcium Citrate-Vitamin D (CITRACAL + D PO) Take by mouth.    diltiazem (CARDIZEM) 90 MG tablet Take 90 mg by mouth 2 (two) times daily.    diphenhydramine-acetaminophen (TYLENOL PM) 25-500 MG TABS tablet Take 1 tablet by mouth at bedtime as needed (sleep/pain).    DULoxetine (CYMBALTA) 30 MG capsule Take 30 mg by mouth daily.    finasteride (PROSCAR) 5 MG tablet Take 5 mg by mouth daily.    IRON PO Take 2 tablets by mouth daily.    magnesium oxide (MAG-OX) 400 MG tablet Take 400 mg by mouth daily.    Multiple Vitamin (MULTIVITAMIN) tablet Take 1 tablet by mouth daily.    predniSONE (DELTASONE) 20 MG tablet Take 20 mg by mouth daily with breakfast.      STOP taking these medications     amLODipine (NORVASC) 5 MG tablet        No Known Allergies Follow-up Information    Follow up with [facility] On 02/14/2016.   Specialty:  Cardiology   Why:   TO SEE CARDIOLOGIST DR. Eden Emms AT 1:15 PM   Contact information:   [address] [place] [address] [phone]      Follow up with Patrick Hector, MD. Schedule an appointment as soon as possible for a visit in 1 week.   Specialty:   Family Medicine   Contact information:   [address] [place] [address] [phone]        The results of significant diagnostics from this hospitalization (including imaging, microbiology, ancillary and laboratory) are listed below for reference.    Significant Diagnostic Studies: US Venous Img Upper Uni Right  01/31/2016  CLINICAL DATA:  Right upper extremity edema and shortness of breath. History of diabetes. Evaluate for DVT. EXAM: RIGHT UPPER EXTREMITY VENOUS DOPPLER ULTRASOUND TECHNIQUE: Gray-scale sonography with graded compression, as well as color Doppler and duplex ultrasound were performed to evaluate the upper extremity deep venous system from the level of the subclavian vein and including the jugular, axillary, basilic, radial, ulnar and upper cephalic vein. Spectral Doppler was utilized to evaluate flow at rest and with distal augmentation maneuvers. COMPARISON:  None. FINDINGS: Contralateral Subclavian Vein: Respiratory phasicity is normal and symmetric  with the symptomatic side. No evidence of thrombus. Normal compressibility. Internal Jugular Vein: No evidence of thrombus. Normal compressibility, respiratory phasicity and response to augmentation. Subclavian Vein: No evidence of thrombus. Normal compressibility, respiratory phasicity and response to augmentation. Axillary Vein: No evidence of thrombus. Normal compressibility, respiratory phasicity and response to augmentation. Cephalic Vein: No evidence of thrombus. Normal compressibility, respiratory phasicity and response to augmentation. Basilic Vein: No evidence of thrombus. Normal compressibility, respiratory phasicity and response to augmentation. Brachial Veins: No evidence of thrombus. Normal compressibility, respiratory phasicity and response to augmentation. Radial Veins: No evidence of thrombus. Normal compressibility, respiratory phasicity and response to augmentation. Ulnar Veins: No evidence of thrombus. Normal  compressibility, respiratory phasicity and response to augmentation. Venous Reflux:  None visualized. Other Findings: A minimal amount of subcutaneous edema is noted throughout the arm. IMPRESSION: No evidence of DVT within the right upper extremity. Electronically Signed   By: Simonne Come M.D.   On: 01/31/2016 11:26   Dg Chest Port 1 View  02/02/2016  CLINICAL DATA:  Community-acquired pneumonia EXAM: PORTABLE CHEST 1 VIEW COMPARISON:  01/30/2016 FINDINGS: Stable cardiac enlargement. Mild vascular congestion. Bilateral lower lobe opacities again identified but mildly to moderately improved. IMPRESSION: Persistent but improved bilateral lower lobe opacifications Electronically Signed   By: Esperanza Heir M.D.   On: 02/02/2016 13:54   Dg Chest Port 1 View  01/30/2016  CLINICAL DATA:  Shortness of breath. EXAM: PORTABLE CHEST 1 VIEW COMPARISON:  Same day. FINDINGS: Stable cardiomediastinal silhouette. No pneumothorax is noted. Increased left perihilar and basilar opacity is noted as well as increased right basilar opacity concerning for atelectasis or infiltrates with associated pleural effusion. Bony thorax is unremarkable. IMPRESSION: Mildly increased bilateral basilar lung opacities are noted concerning for worsening atelectasis or pneumonia. Electronically Signed   By: Lupita Raider, M.D.   On: 01/30/2016 18:33   Dg Chest Port 1 View  01/30/2016  CLINICAL DATA:  Cough and fever EXAM: PORTABLE CHEST 1 VIEW COMPARISON:  08/23/2014 FINDINGS: Bibasilar airspace disease left greater than right. Negative for heart failure. No significant effusion identified. Heart size upper normal. IMPRESSION: Bibasilar airspace disease left greater than right. Findings are suspicious for pneumonia. Electronically Signed   By: Marlan Palau M.D.   On: 01/30/2016 09:30    Microbiology: Recent Results (from the past 240 hour(s))  Culture, blood (Routine x 2)     Status: None   Collection Time: 01/30/16  9:20 AM   Result Value Ref Range Status   Specimen Description BLOOD LEFT ANTECUBITAL DRAWN BY RN LR  Final   Special Requests BOTTLES DRAWN AEROBIC AND ANAEROBIC [facility] EACH  Final   Culture NO GROWTH 5 DAYS  Final   Report Status 02/04/2016 FINAL  Final  Culture, blood (Routine x 2)     Status: Abnormal   Collection Time: 01/30/16  9:30 AM  Result Value Ref Range Status   Specimen Description BLOOD RIGHT ANTECUBITAL  Final   Special Requests BOTTLES DRAWN AEROBIC AND ANAEROBIC [facility] EACH  Final   Culture  Setup Time   Final    GRAM POSITIVE COCCI ANAEROBIC BOTTLE ONLY Gram Stain Report Called to,Read Back By and Verified With: MORRIS,C ON 01/31/16 @0840  BY MITCHELL,S    Culture (A)  Final    ACINETOBACTER SPECIES CORRECTED RESULTS GRAM NEGATIVE COCCOBACILLI CORRECTED RESULTS CALLED TO: , PHARM AT 1230 ON [address] BY [id number] Performed at [facility]    Report Status 02/02/2016 FINAL  Final  Organism ID, Bacteria ACINETOBACTER SPECIES  Final      Susceptibility   Acinetobacter species - MIC*    CEFTAZIDIME 4 SENSITIVE Sensitive     CEFTRIAXONE 16 INTERMEDIATE Intermediate     CIPROFLOXACIN <=0.25 SENSITIVE Sensitive     GENTAMICIN <=1 SENSITIVE Sensitive     IMIPENEM <=0.25 SENSITIVE Sensitive     PIP/TAZO <=4 SENSITIVE Sensitive     TRIMETH/SULFA <=20 SENSITIVE Sensitive     AMPICILLIN/SULBACTAM <=2 SENSITIVE Sensitive     * ACINETOBACTER SPECIES  Blood Culture ID Panel (Reflexed)     Status: None   Collection Time: 01/30/16  9:30 AM  Result Value Ref Range Status   Enterococcus species NOT DETECTED NOT DETECTED Final   Vancomycin resistance NOT DETECTED NOT DETECTED Final   Listeria monocytogenes NOT DETECTED NOT DETECTED Final   Staphylococcus species NOT DETECTED NOT DETECTED Final   Staphylococcus aureus NOT DETECTED NOT DETECTED Final   Methicillin resistance NOT DETECTED NOT DETECTED Final   Streptococcus species NOT DETECTED NOT DETECTED Final   Streptococcus  agalactiae NOT DETECTED NOT DETECTED Final   Streptococcus pneumoniae NOT DETECTED NOT DETECTED Final   Streptococcus pyogenes NOT DETECTED NOT DETECTED Final   Acinetobacter baumannii NOT DETECTED NOT DETECTED Final   Enterobacteriaceae species NOT DETECTED NOT DETECTED Final   Enterobacter cloacae complex NOT DETECTED NOT DETECTED Final   Escherichia coli NOT DETECTED NOT DETECTED Final   Klebsiella oxytoca NOT DETECTED NOT DETECTED Final   Klebsiella pneumoniae NOT DETECTED NOT DETECTED Final   Proteus species NOT DETECTED NOT DETECTED Final   Serratia marcescens NOT DETECTED NOT DETECTED Final   Carbapenem resistance NOT DETECTED NOT DETECTED Final   Haemophilus influenzae NOT DETECTED NOT DETECTED Final   Neisseria meningitidis NOT DETECTED NOT DETECTED Final   Pseudomonas aeruginosa NOT DETECTED NOT DETECTED Final   Candida albicans NOT DETECTED NOT DETECTED Final   Candida glabrata NOT DETECTED NOT DETECTED Final   Candida krusei NOT DETECTED NOT DETECTED Final   Candida parapsilosis NOT DETECTED NOT DETECTED Final   Candida tropicalis NOT DETECTED NOT DETECTED Final    Comment: Performed at [facility]  MRSA PCR Screening     Status: None   Collection Time: 01/30/16  1:15 PM  Result Value Ref Range Status   MRSA by PCR NEGATIVE NEGATIVE Final    Comment:        The GeneXpert MRSA Assay (FDA approved for NASAL specimens only), is one component of a comprehensive MRSA colonization surveillance program. It is not intended to diagnose MRSA infection nor to guide or monitor treatment for MRSA infections.   Urine culture     Status: None   Collection Time: 01/30/16  9:40 PM  Result Value Ref Range Status   Specimen Description URINE, CATHETERIZED  Final   Special Requests NONE  Final   Culture NO GROWTH Performed at [facility]   Final   Report Status 02/01/2016 FINAL  Final  Culture, blood (Routine X 2) w Reflex to ID Panel     Status: None (Preliminary  result)   Collection Time: 02/04/16  6:51 AM  Result Value Ref Range Status   Specimen Description BLOOD PICC DRAW  Final   Special Requests BOTTLES DRAWN AEROBIC AND ANAEROBIC [facility] EACH  Final   Culture PENDING  Incomplete   Report Status PENDING  Incomplete  Culture, blood (Routine X 2) w Reflex to ID Panel     Status: None (Preliminary result)   Collection  Time: 02/04/16  7:53 AM  Result Value Ref Range Status   Specimen Description RIGHT ANTECUBITAL  Final   Special Requests BOTTLES DRAWN AEROBIC AND ANAEROBIC [facility] EACH  Final   Culture PENDING  Incomplete   Report Status PENDING  Incomplete     Labs: Basic Metabolic Panel:  Recent Labs Lab 01/30/16 2039 02/01/16 0351 02/02/16 0622 02/03/16 0517 02/04/16 0651  NA 137 137 138 141 139  K 4.2 3.7 2.9* 3.6 3.3*  CL 109 103 104 108 107  CO2 22 26 27 27 24   GLUCOSE 162* 206* 90 107* 61*  BUN 29* 38* 39* 40* 33*  CREATININE 2.29* 2.39* 1.90* 1.84* 1.69*  CALCIUM 8.2* 8.0* 7.9* 7.5* 7.1*   Liver Function Tests:  Recent Labs Lab 01/30/16 0920 01/30/16 2039  AST 17 16  ALT 18 16*  ALKPHOS 96 70  BILITOT 0.6 0.6  PROT 6.4* 5.0*  ALBUMIN 2.9* 2.3*   No results for input(s): LIPASE, AMYLASE in the last 168 hours. No results for input(s): AMMONIA in the last 168 hours. CBC:  Recent Labs Lab 01/30/16 0920 01/30/16 2039 01/31/16 0837 02/01/16 0351 02/02/16 0622 02/04/16 0651  WBC 13.5* 11.1* 14.4* 13.4* 15.0* 9.7  NEUTROABS 11.7*  --   --   --   --   --   HGB 10.9* 9.9* 10.3* 8.7* 8.5* 8.8*  HCT 36.9* 33.1* 34.5* 28.6* 27.7* 29.1*  MCV 83.1 82.5 83.3 82.2 79.8 82.2  PLT 367 225 265 268 292 363   Cardiac Enzymes: No results for input(s): CKTOTAL, CKMB, CKMBINDEX, TROPONINI in the last 168 hours. BNP: BNP (last 3 results)  Recent Labs  02/04/16 0651  BNP 603.0*    ProBNP (last 3 results) No results for input(s): PROBNP in the last 8760 hours.  CBG:  Recent Labs Lab 02/03/16 1143 02/03/16 1607  02/03/16 2107 02/04/16 0734 02/04/16 1109  GLUCAP 160* 164* 157* 56* 136*       Signed:  Melayna Robarts MD.  [facility] 02/04/2016, 4:55 PM

## 2016-02-04 NOTE — Progress Notes (Signed)
ANTICOAGULATION CONSULT NOTE - Initial Consult  Pharmacy Consult for xarelto Indication: aflutter  No Known Allergies  Patient Measurements: Height: 5' (152.4 cm) Weight: 210 lb 5.1 oz (95.4 kg) IBW/kg (Calculated) : 50   Vital Signs: Temp: 99.2 F (37.3 C) (05/27 0609) Temp Source: Oral (05/27 0609) BP: 135/88 mmHg (05/27 0609) Pulse Rate: 122 (05/27 0609)  Labs:  Recent Labs  02/02/16 0622 02/03/16 0517 02/04/16 0651  HGB 8.5*  --  8.8*  HCT 27.7*  --  29.1*  PLT 292  --  363  CREATININE 1.90* 1.84* 1.69*    Estimated Creatinine Clearance: 37.6 mL/min (by C-G formula based on Cr of 1.69).   Medical History: Past Medical History  Diagnosis Date  . Diabetes mellitus without complication (HCC)   . Hypertension   . Hyperlipidemia   . Ulcer of sacral region, stage 1 02/01/2016  . Aortic valve stenosis, severe 02/02/2016    Mean gradient 39 mmHg. Ef 55-60%.     Medications:  Prescriptions prior to admission  Medication Sig Dispense Refill Last Dose  . acetaminophen (TYLENOL) 650 MG CR tablet Take 1,300 mg by mouth every 8 (eight) hours as needed for pain.   unknown  . ALPRAZolam (XANAX) 0.5 MG tablet Take 0.5 mg by mouth at bedtime.   01/29/2016 at Unknown time  . amLODipine (NORVASC) 5 MG tablet Take 5 mg by mouth daily.   01/29/2016 at Unknown time  . aspirin EC 81 MG tablet Take 81 mg by mouth daily.   01/29/2016 at Unknown time  . atorvastatin (LIPITOR) 20 MG tablet Take 20 mg by mouth daily.   01/29/2016 at Unknown time  . Calcium Citrate-Vitamin D (CITRACAL + D PO) Take by mouth.   01/29/2016 at Unknown time  . diltiazem (CARDIZEM) 90 MG tablet Take 90 mg by mouth 2 (two) times daily.   01/29/2016 at Unknown time  . diphenhydramine-acetaminophen (TYLENOL PM) 25-500 MG TABS tablet Take 1 tablet by mouth at bedtime as needed (sleep/pain).   01/29/2016 at Unknown time  . DULoxetine (CYMBALTA) 30 MG capsule Take 30 mg by mouth daily.   01/29/2016 at Unknown time  .  finasteride (PROSCAR) 5 MG tablet Take 5 mg by mouth daily.   01/29/2016 at Unknown time  . insulin glargine (LANTUS) 100 UNIT/ML injection Inject 30 Units into the skin at bedtime.    01/29/2016 at Unknown time  . insulin lispro (HUMALOG) 100 UNIT/ML injection Inject 10-20 Units into the skin 3 (three) times daily before meals.    01/29/2016 at Unknown time  . IRON PO Take 2 tablets by mouth daily.   01/29/2016 at Unknown time  . magnesium oxide (MAG-OX) 400 MG tablet Take 400 mg by mouth daily.   01/29/2016 at Unknown time  . Multiple Vitamin (MULTIVITAMIN) tablet Take 1 tablet by mouth daily.   01/29/2016 at Unknown time  . potassium chloride SA (K-DUR,KLOR-CON) 20 MEQ tablet Take 20 mEq by mouth daily.   01/29/2016 at Unknown time  . predniSONE (DELTASONE) 20 MG tablet Take 20 mg by mouth daily with breakfast.   01/29/2016 at Unknown time    Assessment: 75 yo man to start xarelto for a flutter.  CrCl ~ 37 ml/min.  He has anemia.  No active bleeding noted Goal of Therapy:  Appropriate anticoagulation dosing Monitor platelets by anticoagulation protocol: Yes   Plan:  Xarelto 15 mg po daily Monitor for bleeding complications  Patrick Tate 02/04/2016,10:20 AM

## 2016-02-09 LAB — CULTURE, BLOOD (ROUTINE X 2)
Culture: NO GROWTH
Culture: NO GROWTH

## 2016-02-14 ENCOUNTER — Encounter: Payer: Medicare Other | Admitting: Cardiovascular Disease

## 2016-04-10 DEATH — deceased

## 2017-01-16 IMAGING — US US EXTREM  UP VENOUS*R*
1 series · 13 of 24 positions shown · non-contrast
Comparison: None.

CLINICAL DATA: Right upper extremity edema and shortness of breath.
History of diabetes. Evaluate for DVT.



[Series 1: us extrem up venous*right* · 0.08mm/px · 13 of 41 slices shown]
[im 1/41]
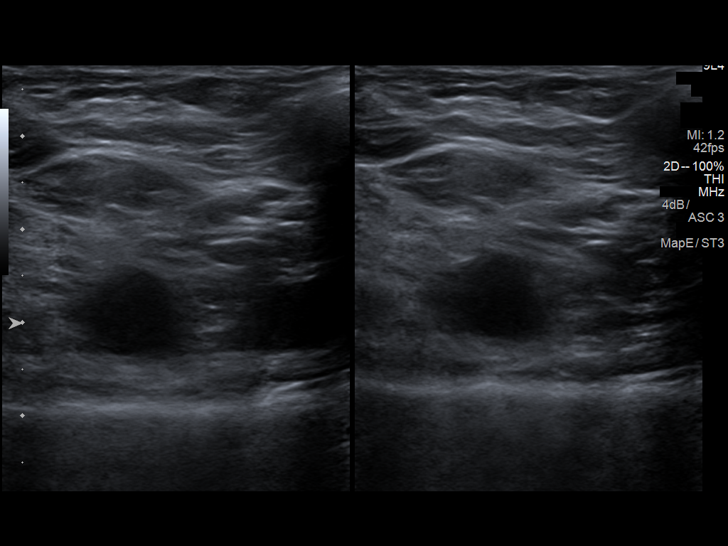
[im 4/41]
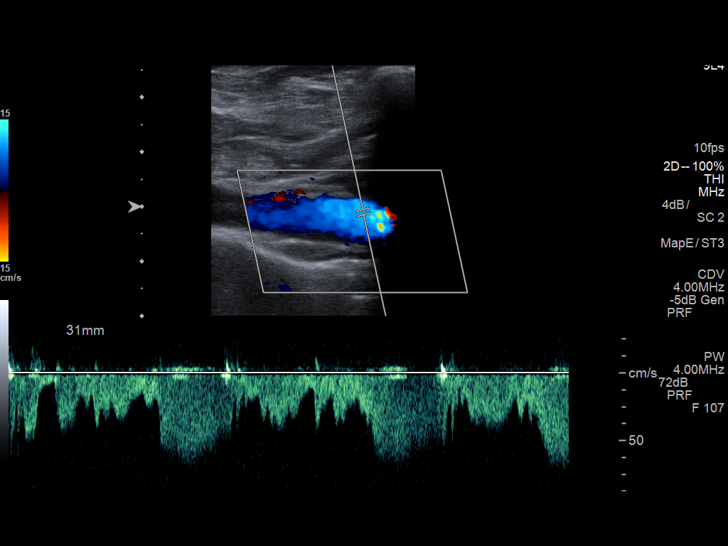
[im 7/41]
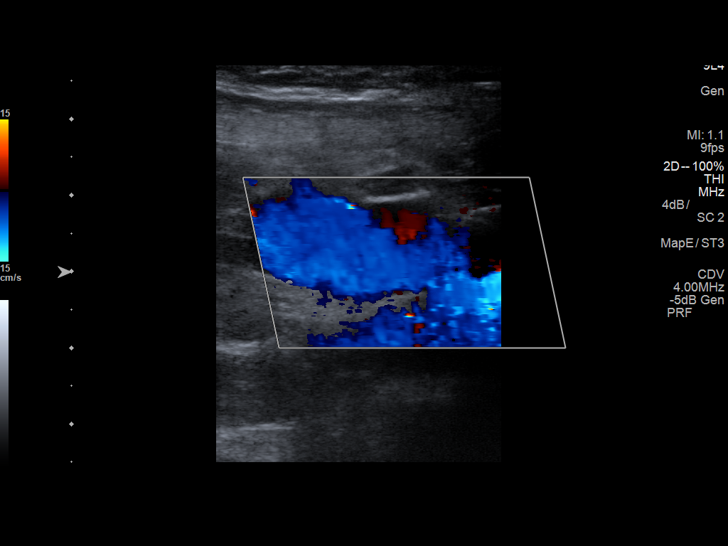
[im 11/41]
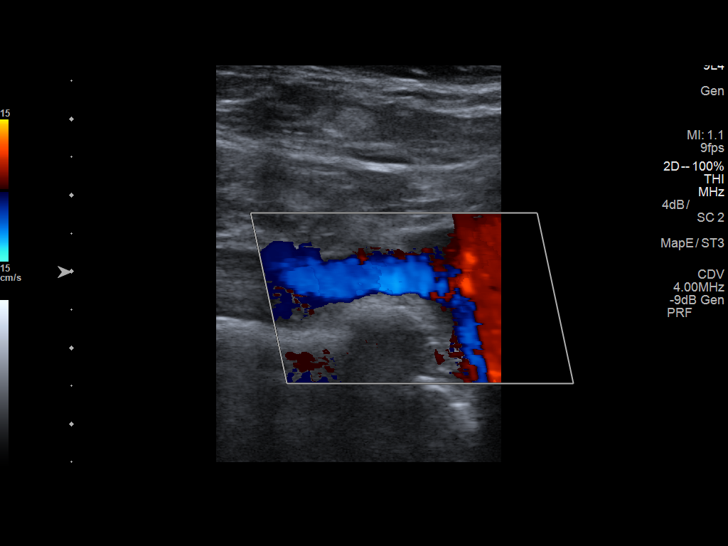
[im 14/41]
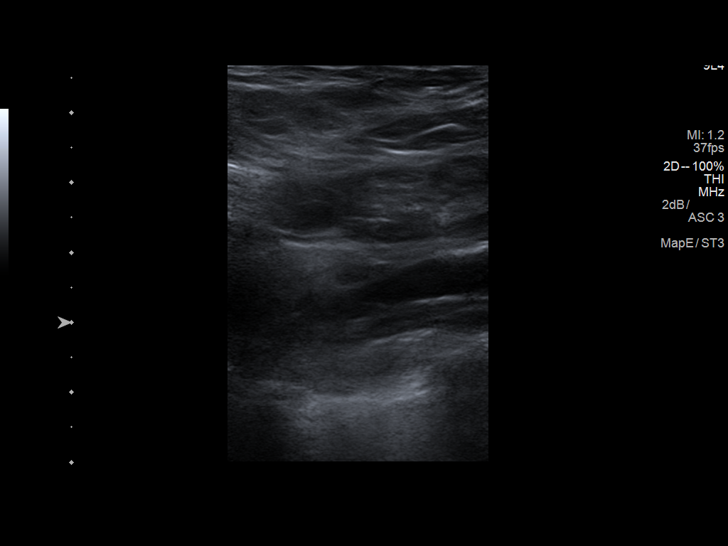
[im 18/41]
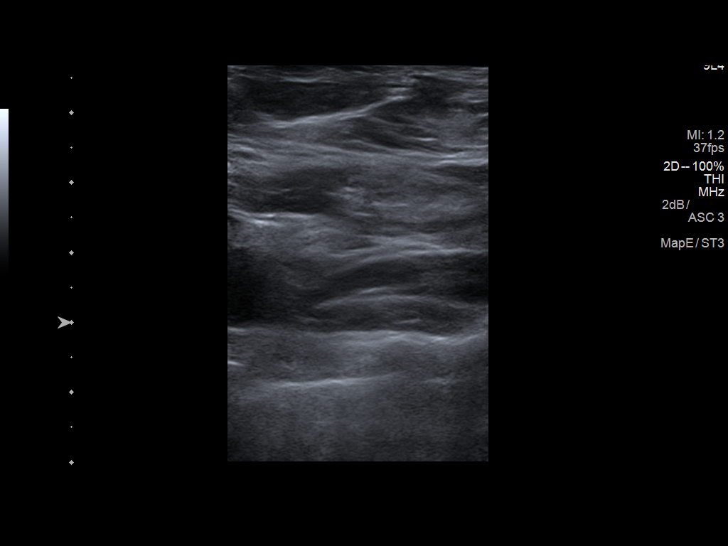
[im 21/41]
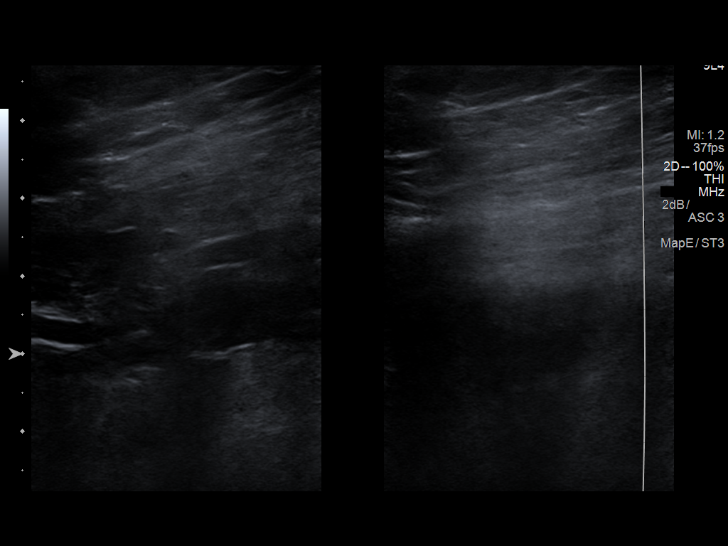
[im 23/41]
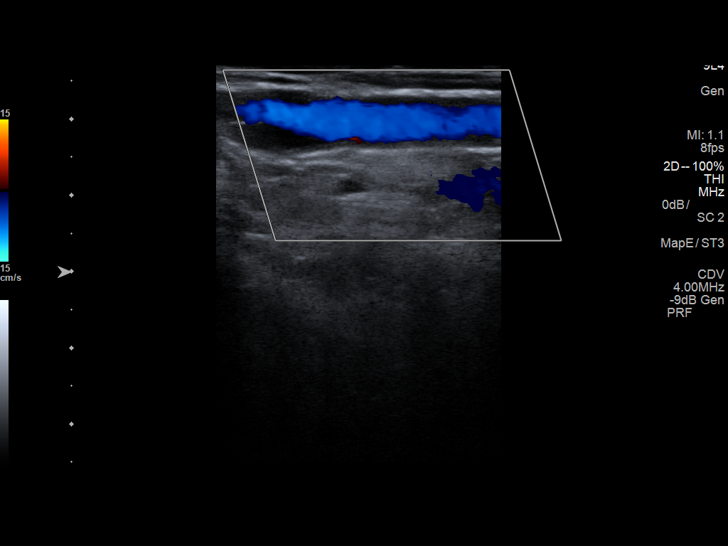
[im 27/41]
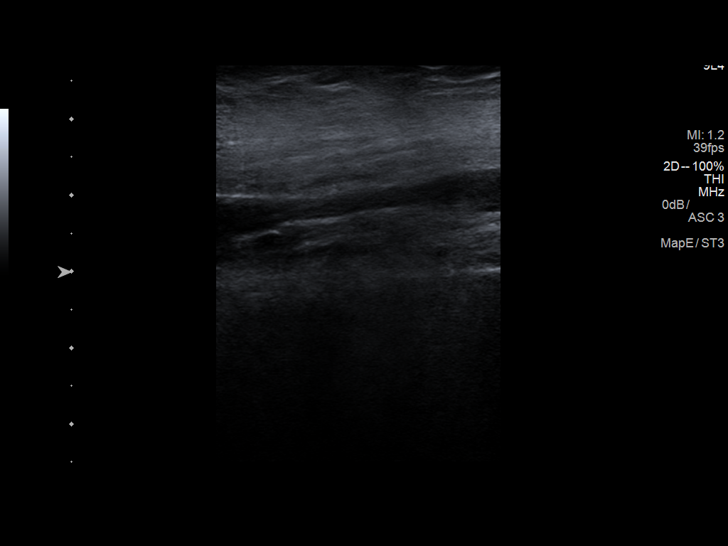
[im 30/41]
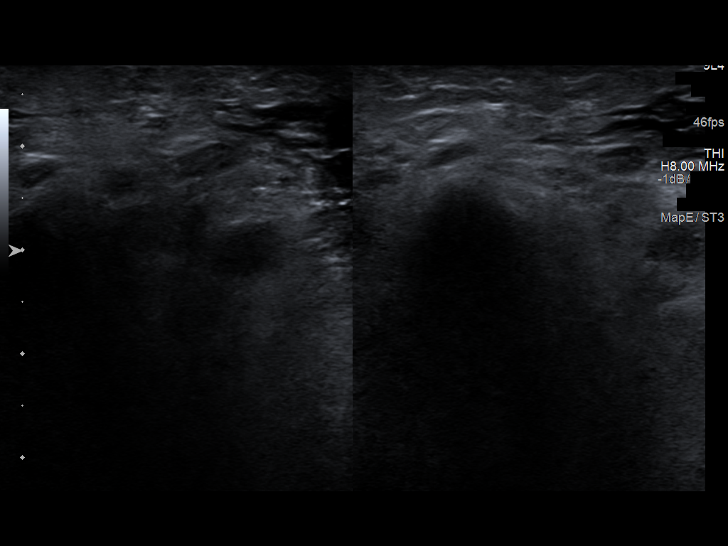
[im 34/41]
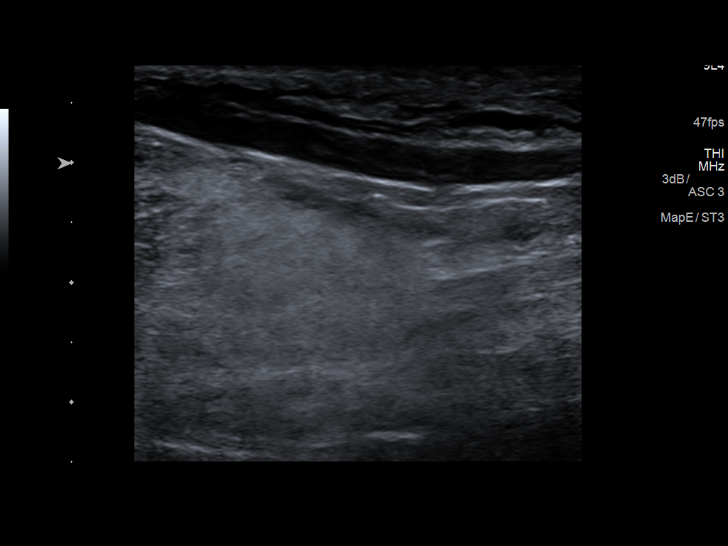
[im 37/41]
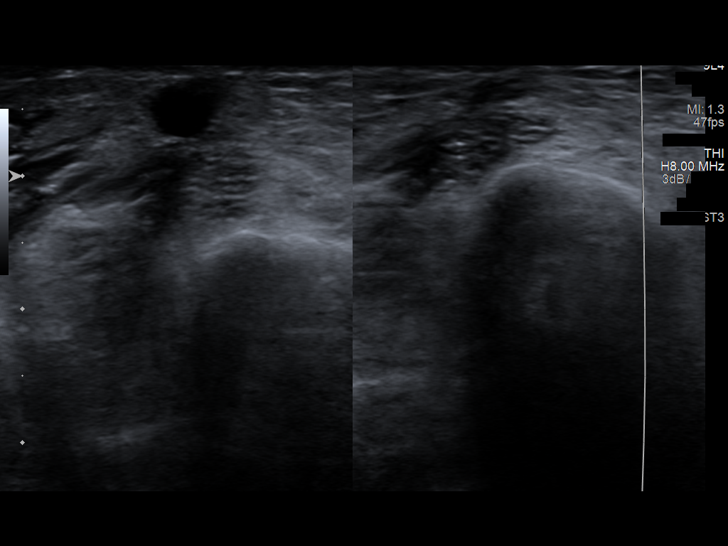
[im 41/41]
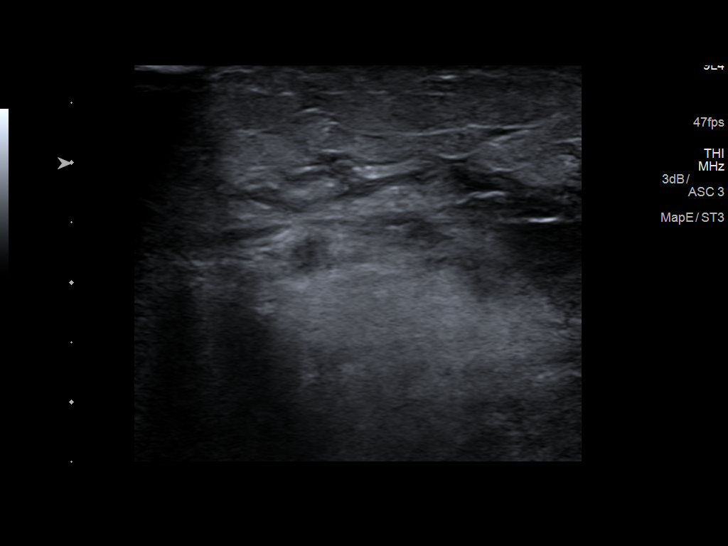

[13 of 24 positions shown; findings below may reference images not displayed]

FINDINGS: Contralateral Subclavian Vein: Respiratory phasicity is normal and
symmetric with the symptomatic side. No evidence of thrombus. Normal
compressibility.

Internal Jugular Vein: No evidence of thrombus. Normal
compressibility, respiratory phasicity and response to augmentation.

Subclavian Vein: No evidence of thrombus. Normal compressibility,
respiratory phasicity and response to augmentation.

Axillary Vein: No evidence of thrombus. Normal compressibility,
respiratory phasicity and response to augmentation.

Cephalic Vein: No evidence of thrombus. Normal compressibility,
respiratory phasicity and response to augmentation.

Basilic Vein: No evidence of thrombus. Normal compressibility,
respiratory phasicity and response to augmentation.

Brachial Veins: No evidence of thrombus. Normal compressibility,
respiratory phasicity and response to augmentation.

Radial Veins: No evidence of thrombus. Normal compressibility,
respiratory phasicity and response to augmentation.

Ulnar Veins: No evidence of thrombus. Normal compressibility,
respiratory phasicity and response to augmentation.

Venous Reflux:  None visualized.

Other Findings: A minimal amount of subcutaneous edema is noted
throughout the arm.
IMPRESSION: No evidence of DVT within the right upper extremity.

## 2017-07-31 IMAGING — CR DG CHEST 1V PORT
1 series · 1 of 1 positions shown · non-contrast
Comparison: Same day.

CLINICAL DATA: Shortness of breath.

EXAM:
PORTABLE CHEST 1 VIEW

[ap portable]
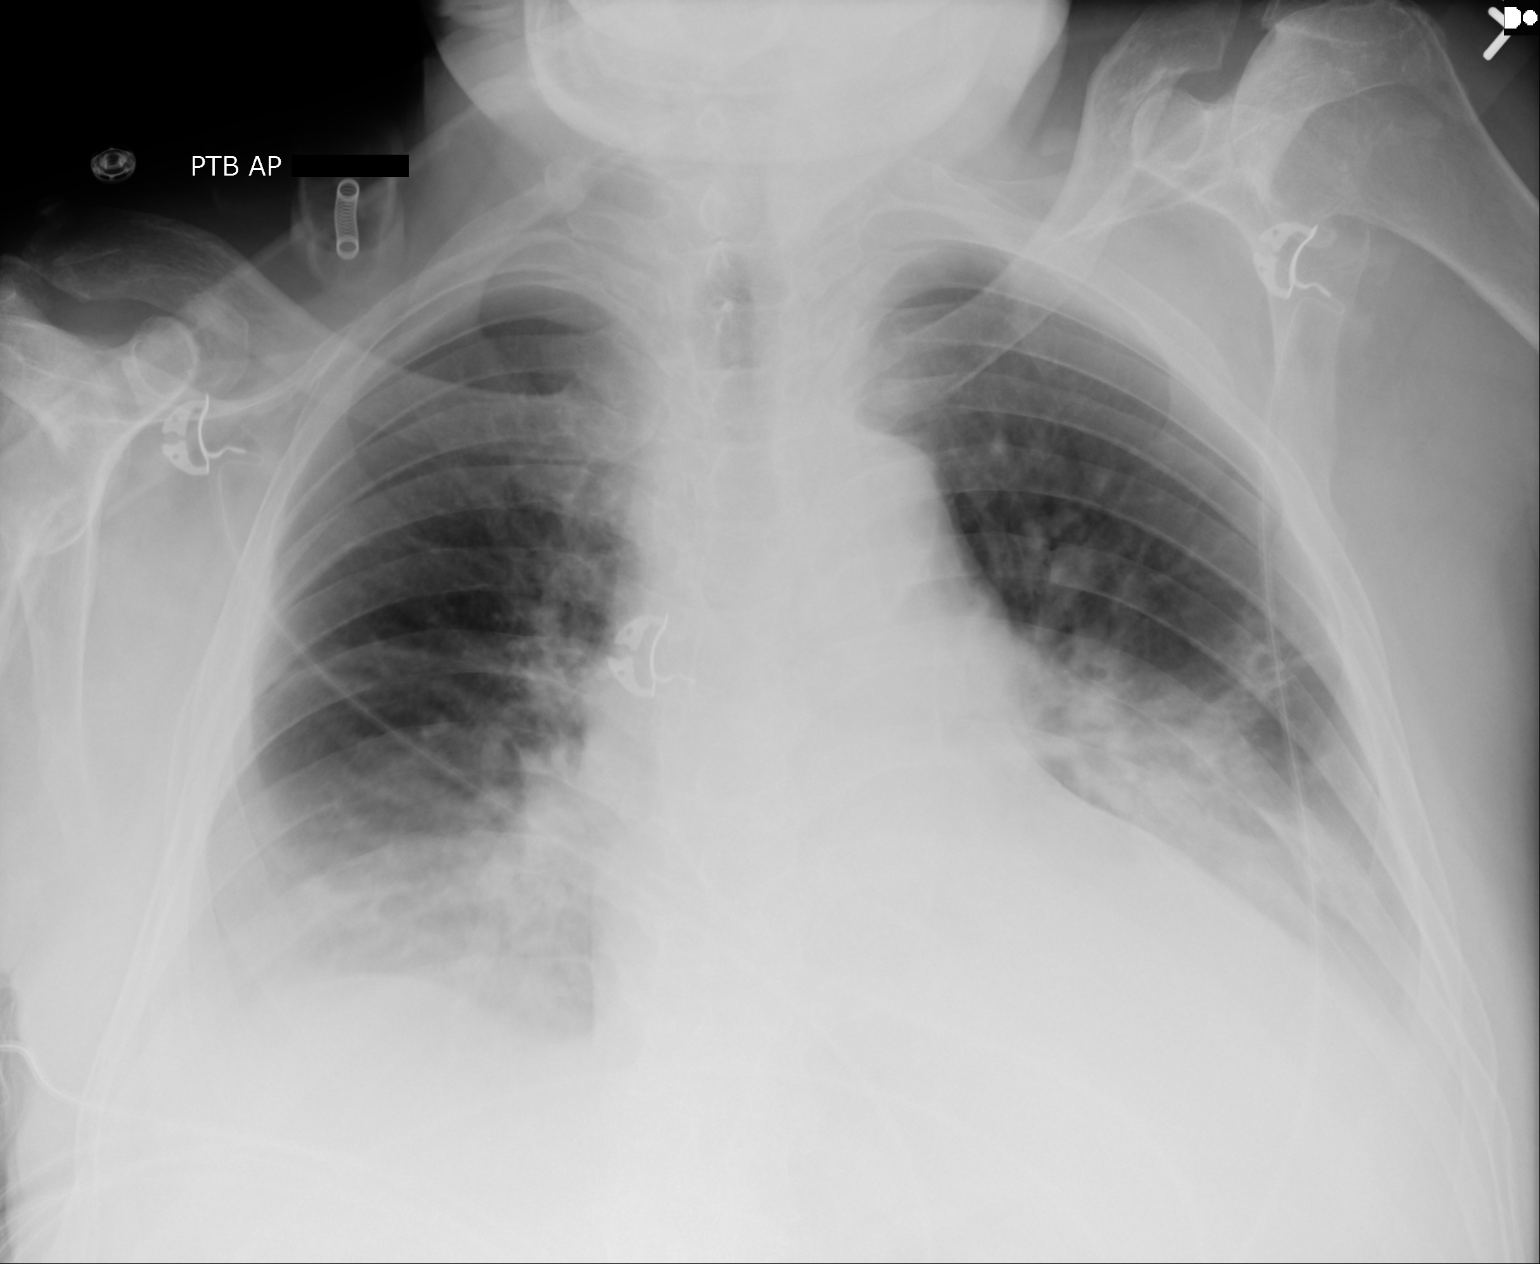

[1 of 1 positions shown; findings below may reference images not displayed]

FINDINGS: Stable cardiomediastinal silhouette. No pneumothorax is noted.
Increased left perihilar and basilar opacity is noted as well as
increased right basilar opacity concerning for atelectasis or
infiltrates with associated pleural effusion. Bony thorax is
unremarkable.
IMPRESSION: Mildly increased bilateral basilar lung opacities are noted
concerning for worsening atelectasis or pneumonia.

## 2017-07-31 IMAGING — CR DG CHEST 1V PORT
1 series · 1 of 1 positions shown · non-contrast
Comparison: 08/23/2014

CLINICAL DATA: Cough and fever

EXAM:
PORTABLE CHEST 1 VIEW

[ap portable]
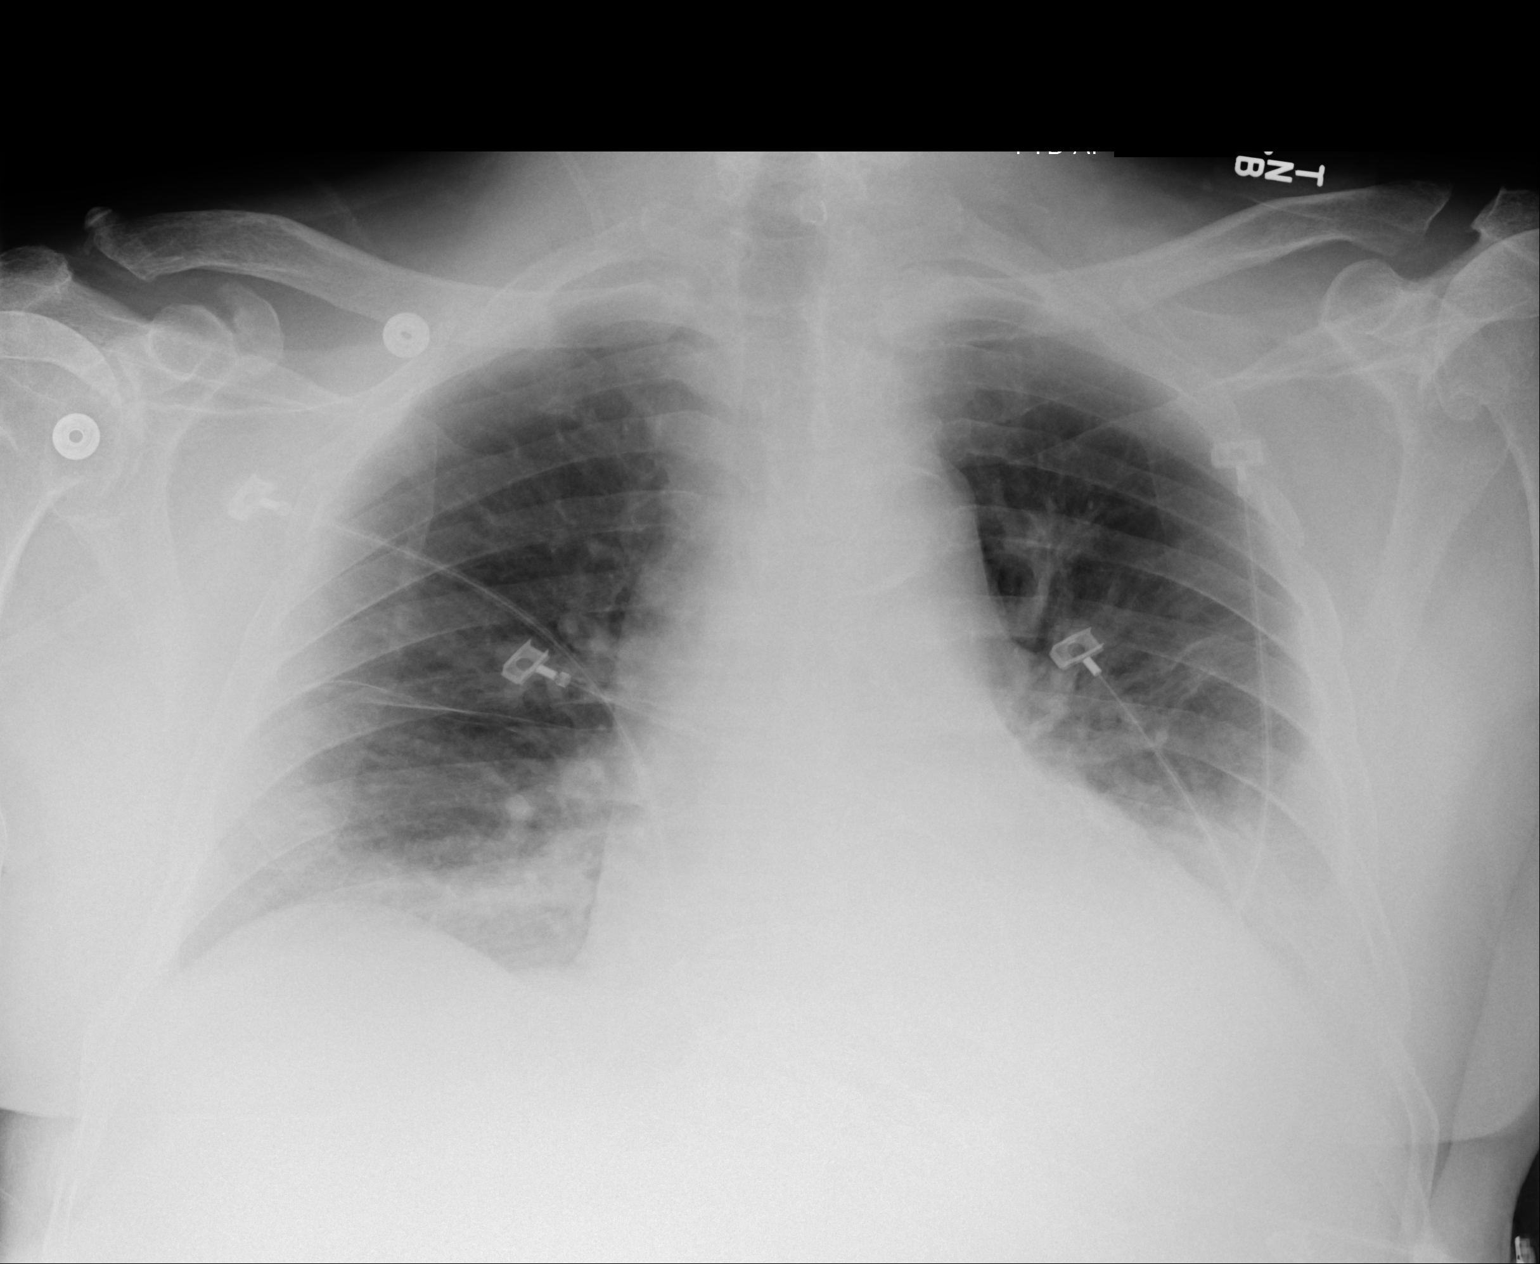

[1 of 1 positions shown; findings below may reference images not displayed]

FINDINGS: Bibasilar airspace disease left greater than right. Negative for
heart failure. No significant effusion identified. Heart size upper
normal.
IMPRESSION: Bibasilar airspace disease left greater than right. Findings are
suspicious for pneumonia.

## 2017-08-03 IMAGING — CR DG CHEST 1V PORT
1 series · 1 of 1 positions shown · non-contrast
Comparison: 01/30/2016

CLINICAL DATA: Community-acquired pneumonia

EXAM:
PORTABLE CHEST 1 VIEW

[ap portable]
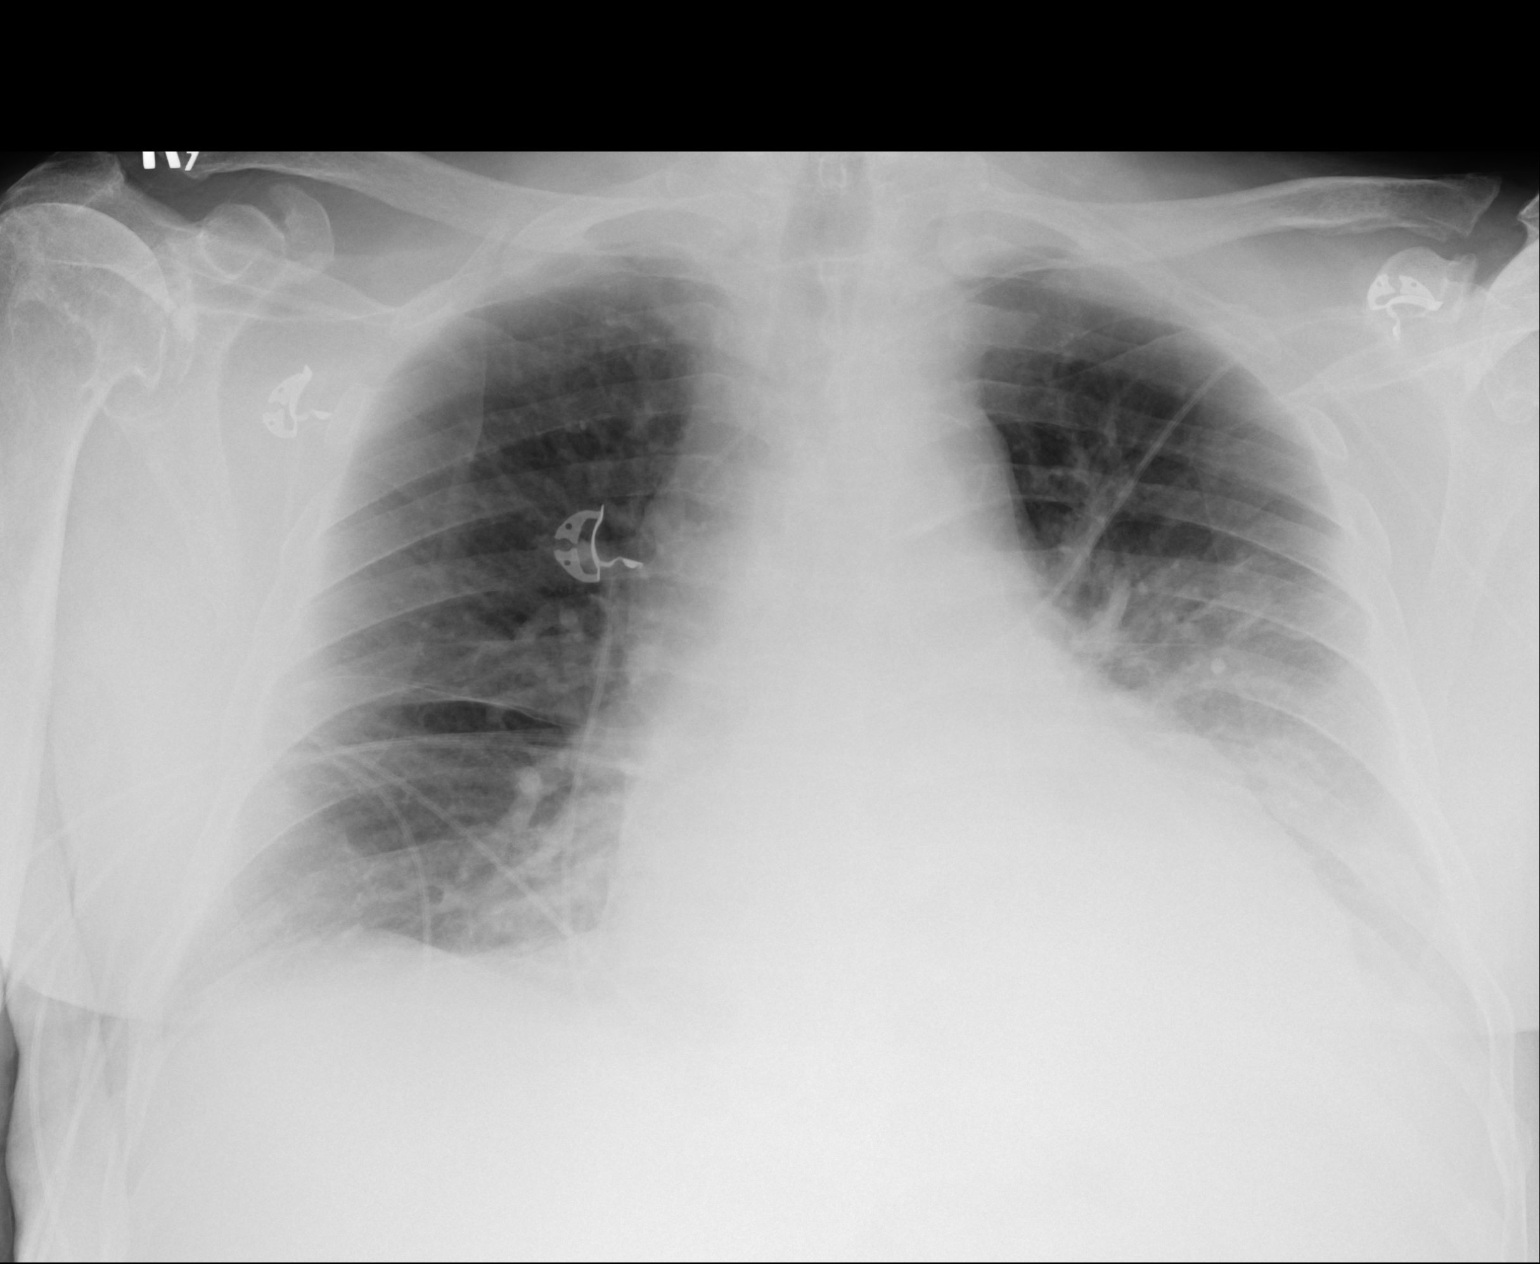

[1 of 1 positions shown; findings below may reference images not displayed]

FINDINGS: Stable cardiac enlargement. Mild vascular congestion. Bilateral
lower lobe opacities again identified but mildly to moderately
improved.
IMPRESSION: Persistent but improved bilateral lower lobe opacifications
